# Patient Record
Sex: Female | Born: 1970 | Race: White | Hispanic: No | Smoking: Current every day smoker
Health system: Southern US, Community
[De-identification: ages and names within clinical notes are randomized; demographics above are authoritative.]

## PROBLEM LIST (undated history)

## (undated) DIAGNOSIS — F32A Depression, unspecified: Secondary | ICD-10-CM

## (undated) DIAGNOSIS — J439 Emphysema, unspecified: Secondary | ICD-10-CM

## (undated) DIAGNOSIS — Z720 Tobacco use: Secondary | ICD-10-CM

## (undated) DIAGNOSIS — Z8619 Personal history of other infectious and parasitic diseases: Secondary | ICD-10-CM

## (undated) DIAGNOSIS — K219 Gastro-esophageal reflux disease without esophagitis: Secondary | ICD-10-CM

## (undated) DIAGNOSIS — F329 Major depressive disorder, single episode, unspecified: Secondary | ICD-10-CM

## (undated) DIAGNOSIS — I38 Endocarditis, valve unspecified: Secondary | ICD-10-CM

## (undated) DIAGNOSIS — F1911 Other psychoactive substance abuse, in remission: Secondary | ICD-10-CM

## (undated) DIAGNOSIS — E119 Type 2 diabetes mellitus without complications: Secondary | ICD-10-CM

## (undated) DIAGNOSIS — M1990 Unspecified osteoarthritis, unspecified site: Secondary | ICD-10-CM

## (undated) DIAGNOSIS — J449 Chronic obstructive pulmonary disease, unspecified: Secondary | ICD-10-CM

## (undated) HISTORY — DX: Unspecified osteoarthritis, unspecified site: M19.90

## (undated) HISTORY — DX: Endocarditis, valve unspecified: I38

## (undated) HISTORY — DX: Depression, unspecified: F32.A

## (undated) HISTORY — DX: Emphysema, unspecified: J43.9

## (undated) HISTORY — DX: Other psychoactive substance abuse, in remission: F19.11

## (undated) HISTORY — DX: Gastro-esophageal reflux disease without esophagitis: K21.9

## (undated) HISTORY — DX: Major depressive disorder, single episode, unspecified: F32.9

## (undated) HISTORY — DX: Personal history of other infectious and parasitic diseases: Z86.19

---

## 1999-01-08 HISTORY — PX: TUBAL LIGATION: SHX77

## 2005-01-07 ENCOUNTER — Emergency Department: Payer: Self-pay | Admitting: General Practice

## 2006-03-05 ENCOUNTER — Encounter: Admission: RE | Admit: 2006-03-05 | Discharge: 2006-03-05 | Payer: Self-pay | Admitting: Family Medicine

## 2007-04-07 ENCOUNTER — Encounter: Payer: Self-pay | Admitting: Family Medicine

## 2007-04-07 ENCOUNTER — Ambulatory Visit: Payer: Self-pay | Admitting: Family Medicine

## 2008-09-22 ENCOUNTER — Ambulatory Visit: Payer: Self-pay | Admitting: Obstetrics & Gynecology

## 2008-09-29 ENCOUNTER — Ambulatory Visit: Payer: Self-pay | Admitting: Obstetrics & Gynecology

## 2010-02-20 ENCOUNTER — Emergency Department: Payer: Self-pay | Admitting: Emergency Medicine

## 2010-05-22 NOTE — Assessment & Plan Note (Signed)
Ashley Powers, Ashley Powers                   ACCOUNT NO.:  [id number]   MEDICAL RECORD NO.:  [id number]          PATIENT TYPE:  POB   LOCATION:  [facility] at [facility]         FACILITY:  [facility]   PHYSICIAN:  Tinnie Gens, MD        DATE OF BIRTH:  [birth year]   DATE OF SERVICE:  04/07/2007                                  CLINIC NOTE   CHIEF COMPLAINT:  Physical exam.   HISTORY OF PRESENT ILLNESS:  The patient is a 40 year old, gravida 2,  para 2, who comes in today for a physical exam.  She has just now gotten  insurance again.  The patient was previously seen for anxiety and  depression and an overactive bladder and treated with some Cymbalta,  Detrol LA and Percocet for chronic back pain.  She has not had medicine  for some time.  She has not been seeing a doctor but previously had a  primary care specialist seeing her.  The patient continues to have  regular cycles.  However, they are fairly heavy, last approximately 7  days, and has severe pain.  She is status post tubal ligation the past.  She has one sexual partner.  She denies significant vaginal discharge.   PAST MEDICAL HISTORY:  Significant for anxiety and depression.   PAST SURGICAL HISTORY:  Tubal ligation.   MEDICATIONS:  Over-the-counter pain relievers like Tylenol as needed.   ALLERGIES:  None known.   OBSTETRICAL HISTORY:  G2, P2, two vaginal deliveries.   GYN HISTORY:  History of abnormal Pap with colposcopy in the past. Last  Pap was a few years ago.   FAMILY HISTORY:  Diabetes, heart disease, skin cancer in her mother.   SOCIAL HISTORY:  She lives with her son and her boyfriend. Her daughter  has been separated from her mother.  She gets her on the weekend.  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 She is a smoker of approximately one for the last 18 years. She  has a history of crack cocaine use approximately 9 years ago. She went  to [place] and got treated.   REVIEW OF SYSTEMS:   A 14-point Review of Systems is reviewed.  Please  see GYN history in the chart.  Positive for muscle aches, fever,  fatigue, headache, shortness of breath, chest pain, loss of urine with  coughing, sneezing and painful intercourse.   PHYSICAL EXAMINATION:  GENERAL:  On exam today, in general she is a well-  developed, well-nourished female in no acute distress.  VITAL SIGNS:  As noted in the chart.  Weight is 119, blood pressure  118/74, pulse 73.  HEENT:  Normocephalic, atraumatic.  Sclerae anicteric.  NECK:  Supple.  Normal thyroid.  BREASTS:  Symmetric with everted nipples.  ABDOMEN:  Soft, nontender, nondistended.  GU:  Normal external female genitalia.  BUS normal.  Vagina is pink and  rugated.  Cervix is parous without lesion.  Uterus small, anteverted.  No adnexal mass or tenderness.   IMPRESSION:  1. Gynecological exam with Pap.  2. Tobacco use.  3. Anxiety and depression.  4. Dysmenorrhea  and menorrhagia.  5. Chronic pain.   PLAN:  1. We will restart Cymbalta and Detrol LA. Check Pap smear today.  2. Start Chantix.  Advised about quit line and calling that, and tips      for quitting smoking given to the patient.  3. Prescription for Naprosyn also given for painful periods.  She is      not a candidate for ACs given her age and smoking status.  4. Possibly candidate for endometrial ablation should medications fail      to alleviate symptoms.  5. She will follow up in 2 months.           ______________________________  Tinnie Gens, MD     TP/MEDQ  D:  04/07/2007  T:  04/07/2007  Job:  [id number]

## 2012-06-17 ENCOUNTER — Ambulatory Visit: Payer: Self-pay

## 2013-06-28 ENCOUNTER — Encounter (INDEPENDENT_AMBULATORY_CARE_PROVIDER_SITE_OTHER): Payer: Self-pay

## 2013-06-28 ENCOUNTER — Encounter: Payer: Self-pay | Admitting: Adult Health

## 2013-06-28 ENCOUNTER — Ambulatory Visit (INDEPENDENT_AMBULATORY_CARE_PROVIDER_SITE_OTHER): Payer: BC Managed Care – PPO | Admitting: Adult Health

## 2013-06-28 VITALS — BP 130/88 | HR 78 | Temp 98.4°F | Resp 14 | Ht 65.0 in | Wt 143.5 lb

## 2013-06-28 DIAGNOSIS — F32A Depression, unspecified: Secondary | ICD-10-CM

## 2013-06-28 DIAGNOSIS — F3289 Other specified depressive episodes: Secondary | ICD-10-CM

## 2013-06-28 DIAGNOSIS — Z23 Encounter for immunization: Secondary | ICD-10-CM

## 2013-06-28 DIAGNOSIS — F329 Major depressive disorder, single episode, unspecified: Secondary | ICD-10-CM | POA: Insufficient documentation

## 2013-06-28 MED ORDER — FLUOXETINE HCL 20 MG PO TABS: 20.0000 mg | ORAL_TABLET | Freq: Every day | ORAL | Status: DC

## 2013-06-28 NOTE — Addendum Note (Signed)
Addended by: Geni Bers on: 06/28/2013 02:36 PM   Modules accepted: Orders

## 2013-06-28 NOTE — Progress Notes (Signed)
Patient ID: Ashley Powers, female   DOB: Dec 08, 1970, 43 y.o.   MRN: [id number]   Subjective:    Patient ID: Ashley Powers, female    DOB: [birth year], 43 y.o.   MRN: [id number]  HPI Pt is a pleasant 43 year old female who presents to clinic to establish care. She has not had a PCP in many years. Recent Pap and mammogram done at [facility] 2014. She has history of depression with ongoing symptoms. Reports that sometimes she does not feel like getting out of bed. In the past, she has had suicidal ideations. Denies any at the present time. Ongoing tobacco abuse with a history of emphysema seen on x-ray per patient report. I will request records from previous PCP.    Past Medical History  Diagnosis Date  . Arthritis   . Depression   . Emphysema of lung   . GERD (gastroesophageal reflux disease)   . History of drug abuse     about 10yrs ago - smoking crack  . History of chicken pox   . Leaky heart valve     needs ABT before dental procedures     Past Surgical History  Procedure Laterality Date  . Tubal ligation  2001     Family History  Problem Relation Age of Onset  . Arthritis Mother   . Cancer Mother     melanoma  . Heart disease Father     4 heart attacks. Died age 28  . Diabetes Father   . Diabetes Paternal Aunt   . Alcohol abuse Maternal Grandfather   . Arthritis Paternal Grandmother   . Alcohol abuse Paternal Grandfather      History   Social History  . Marital Status: Married    Spouse Name: N/A    Number of Children: 2  . Years of Education: N/A   Occupational History  . Theatre manager   Social History Main Topics  . Smoking status: Current Every Day Smoker -- 1.00 packs/day for 24 years    Types: Cigarettes  . Smokeless tobacco: Never Used  . Alcohol Use: 1.2 oz/week    2 Glasses of wine per week  . Drug Use: Yes     Comment: marijuana occasionally   . Sexual Activity: Not on file   Other Topics Concern  . Not on  file   Social History Narrative   Ashley Powers grew up in [place], [place]. She is married and has two children Ashley Powers, Ashley Powers). 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 Hobbies: reading.      Caffeine - 1 cup of coffee   Exercise - none at present    Review of Systems  Constitutional: Negative.   HENT: Negative.   Eyes: Negative.   Respiratory: Negative.   Cardiovascular: Negative.   Gastrointestinal: Negative.   Endocrine: Negative.   Genitourinary: Negative.   Musculoskeletal: Negative.   Skin: Negative.   Allergic/Immunologic: Negative.   Neurological: Negative.   Hematological: Negative.   Psychiatric/Behavioral:       Depression. Sometimes does not want to get out of bed       Objective:  BP 130/88  Pulse 78  Temp(Src) 98.4 F (36.9 C) (Oral)  Resp 14  Ht 5\' 5"  (1.651 m)  Wt 143 lb 8 oz (65.091 kg)  BMI 23.88 kg/m2  SpO2 96%   Physical Exam  Constitutional: She is oriented to person, place, and time. No distress.  HENT:  Head:  Normocephalic and atraumatic.  Eyes: Conjunctivae and EOM are normal.  Neck: Normal range of motion. Neck supple.  Cardiovascular: Normal rate, regular rhythm, normal heart sounds and intact distal pulses.  Exam reveals no gallop and no friction rub.   No murmur heard. Pulmonary/Chest: Effort normal and breath sounds normal. No respiratory distress. She has no wheezes. She has no rales.  Musculoskeletal: Normal range of motion.  Neurological: She is alert and oriented to person, place, and time. She has normal reflexes. Coordination normal.  Skin: Skin is warm and dry.  Psychiatric: She has a normal mood and affect. Her behavior is normal. Judgment and thought content normal.      Assessment & Plan:   1. Depression Start prozac 20 mg daily. RTC in 1 month for follow up. I have asked her to come to next visit fasting so that I can check labs.

## 2013-06-28 NOTE — Progress Notes (Signed)
Pre visit review using our clinic review tool, if applicable. No additional management support is needed unless otherwise documented below in the visit note. 

## 2013-06-28 NOTE — Patient Instructions (Signed)
  Start Prozac 20 mg every morning.  Please schedule appointment in 1 month for follow up.  Please do not eat or drink anything prior to coming to the next appointment so that I can check your labs.

## 2013-07-26 ENCOUNTER — Ambulatory Visit: Payer: BC Managed Care – PPO | Admitting: Adult Health

## 2013-07-26 DIAGNOSIS — Z0289 Encounter for other administrative examinations: Secondary | ICD-10-CM

## 2014-12-16 ENCOUNTER — Other Ambulatory Visit: Payer: Self-pay | Admitting: Adult Health

## 2014-12-16 DIAGNOSIS — F17200 Nicotine dependence, unspecified, uncomplicated: Secondary | ICD-10-CM | POA: Insufficient documentation

## 2014-12-16 DIAGNOSIS — F320 Major depressive disorder, single episode, mild: Secondary | ICD-10-CM | POA: Insufficient documentation

## 2014-12-19 ENCOUNTER — Other Ambulatory Visit: Payer: Self-pay | Admitting: Adult Health

## 2014-12-19 DIAGNOSIS — Z0000 Encounter for general adult medical examination without abnormal findings: Secondary | ICD-10-CM

## 2015-01-04 ENCOUNTER — Ambulatory Visit
Admission: RE | Admit: 2015-01-04 | Discharge: 2015-01-04 | Disposition: A | Discharge status: Home / Self Care | Payer: BLUE CROSS/BLUE SHIELD | Source: Ambulatory Visit | Attending: Family Medicine | Admitting: Family Medicine

## 2015-01-04 DIAGNOSIS — Z1231 Encounter for screening mammogram for malignant neoplasm of breast: Secondary | ICD-10-CM | POA: Insufficient documentation

## 2015-01-04 DIAGNOSIS — Z0000 Encounter for general adult medical examination without abnormal findings: Secondary | ICD-10-CM

## 2015-04-06 ENCOUNTER — Other Ambulatory Visit: Payer: Self-pay | Admitting: Adult Health

## 2015-04-06 DIAGNOSIS — N939 Abnormal uterine and vaginal bleeding, unspecified: Secondary | ICD-10-CM

## 2015-04-07 ENCOUNTER — Ambulatory Visit
Admission: RE | Admit: 2015-04-07 | Discharge: 2015-04-07 | Disposition: A | Discharge status: Home / Self Care | Payer: BLUE CROSS/BLUE SHIELD | Source: Ambulatory Visit | Attending: Adult Health | Admitting: Adult Health

## 2015-04-07 DIAGNOSIS — N939 Abnormal uterine and vaginal bleeding, unspecified: Secondary | ICD-10-CM | POA: Diagnosis present

## 2015-04-07 DIAGNOSIS — D72825 Bandemia: Secondary | ICD-10-CM | POA: Insufficient documentation

## 2015-04-10 ENCOUNTER — Other Ambulatory Visit: Payer: Self-pay | Admitting: Obstetrics and Gynecology

## 2015-04-10 DIAGNOSIS — N898 Other specified noninflammatory disorders of vagina: Secondary | ICD-10-CM

## 2015-05-02 ENCOUNTER — Ambulatory Visit: Payer: BLUE CROSS/BLUE SHIELD

## 2017-08-29 ENCOUNTER — Other Ambulatory Visit: Payer: Self-pay | Admitting: Podiatry

## 2017-08-29 ENCOUNTER — Encounter: Payer: Self-pay | Admitting: Podiatry

## 2017-08-29 ENCOUNTER — Ambulatory Visit (INDEPENDENT_AMBULATORY_CARE_PROVIDER_SITE_OTHER): Payer: BLUE CROSS/BLUE SHIELD

## 2017-08-29 ENCOUNTER — Ambulatory Visit: Payer: BLUE CROSS/BLUE SHIELD | Admitting: Podiatry

## 2017-08-29 DIAGNOSIS — M779 Enthesopathy, unspecified: Secondary | ICD-10-CM

## 2017-08-29 DIAGNOSIS — M722 Plantar fascial fibromatosis: Secondary | ICD-10-CM

## 2017-08-29 MED ORDER — METHYLPREDNISOLONE 4 MG PO TBPK: ORAL_TABLET | ORAL | 0 refills | Status: DC

## 2017-08-29 MED ORDER — MELOXICAM 15 MG PO TABS: 15.0000 mg | ORAL_TABLET | Freq: Every day | ORAL | 1 refills | Status: AC

## 2017-09-01 NOTE — Progress Notes (Signed)
   Subjective: 47 year old female presenting today as a new patient with a chief complaint of burning pain to the bilateral feet and arches, right worse than left, that began 2-3 months ago. She reports the feet feel tired. She states the pain is worse after walking and standing for long periods of time and when she first gets out of bed in the morning. Standing after sitting for a while also increases the pain. She has applied ice therapy and has been massaging the feet for treatment. Patient is here for further evaluation and treatment.   Past Medical History:  Diagnosis Date  . Arthritis   . Depression   . Emphysema of lung ([place])   . GERD (gastroesophageal reflux disease)   . History of chicken pox   . History of drug abuse    about 47yrs ago - smoking crack  . Leaky heart valve    needs ABT before dental procedures     Objective: Physical Exam General: The patient is alert and oriented x3 in no acute distress.  Dermatology: Skin is warm, dry and supple bilateral lower extremities. Negative for open lesions or macerations bilateral.   Vascular: Dorsalis Pedis and Posterior Tibial pulses palpable bilateral.  Capillary fill time is immediate to all digits.  Neurological: Epicritic and protective threshold intact bilateral.   Musculoskeletal: Tenderness to palpation to the plantar aspect of the bilateral heels along the plantar fascia. All other joints range of motion within normal limits bilateral. Strength 5/5 in all groups bilateral.   Radiographic exam: Normal osseous mineralization. Joint spaces preserved. No fracture/dislocation/boney destruction. No other soft tissue abnormalities or radiopaque foreign bodies.   Assessment: 1. plantar fasciitis bilateral feet - midsubstance   Plan of Care:  1. Patient evaluated. Xrays reviewed.   2. Injection of 0.5cc Celestone soluspan injected into the bilateral heels.  3. Rx for Medrol Dose Pak placed 4. Rx for Meloxicam ordered for  patient. 5. Plantar fascial band(s) dispensed for bilateral plantar fasciitis. 6. Instructed patient regarding therapies and modalities at home to alleviate symptoms.  7. Recommended OTC insoles from [employer].  8. Return to clinic in 4 weeks.    Edrick Kins, DPM [facility]  Dr. Edrick Kins, DPM    [address], [place] [address]                Office [phone]  Fax [phone]

## 2017-09-23 ENCOUNTER — Ambulatory Visit (INDEPENDENT_AMBULATORY_CARE_PROVIDER_SITE_OTHER): Payer: BLUE CROSS/BLUE SHIELD | Admitting: Podiatry

## 2017-09-23 ENCOUNTER — Encounter: Payer: Self-pay | Admitting: Podiatry

## 2017-09-23 DIAGNOSIS — M722 Plantar fascial fibromatosis: Secondary | ICD-10-CM

## 2017-09-25 NOTE — Progress Notes (Signed)
   Subjective: 47 year old female presenting today for follow up evaluation of bilateral plantar fasciitis. She reports some pain last week but states it was not as bad as it has been in the past. She has been wearing the fascial braces and taking Meloxicam which helps alleviate the pain. Standing or walking excessively increases the symptoms. Patient is here for further evaluation and treatment.   Past Medical History:  Diagnosis Date  . Arthritis   . Depression   . Emphysema of lung ([place])   . GERD (gastroesophageal reflux disease)   . History of chicken pox   . History of drug abuse    about 61yrs ago - smoking crack  . Leaky heart valve    needs ABT before dental procedures     Objective: Physical Exam General: The patient is alert and oriented x3 in no acute distress.  Dermatology: Skin is warm, dry and supple bilateral lower extremities. Negative for open lesions or macerations bilateral.   Vascular: Dorsalis Pedis and Posterior Tibial pulses palpable bilateral.  Capillary fill time is immediate to all digits.  Neurological: Epicritic and protective threshold intact bilateral.   Musculoskeletal: Tenderness to palpation to the plantar aspect of the bilateral heels along the plantar fascia. All other joints range of motion within normal limits bilateral. Strength 5/5 in all groups bilateral.    Assessment: 1. plantar fasciitis bilateral feet - midsubstance   Plan of Care:  1. Patient evaluated. 2. Injection of 0.5cc Celestone soluspan injected into the bilateral heels.  3. Continue taking Meloxicam.  4. Recommended good shoe gear.  5. Return to clinic in 4 weeks.   Works at [employer].   Edrick Kins, DPM [facility]  Dr. Edrick Kins, DPM    [address], [place] [address]                Office [phone]  Fax [phone]

## 2017-10-21 ENCOUNTER — Encounter: Payer: BLUE CROSS/BLUE SHIELD | Admitting: Podiatry

## 2017-10-27 NOTE — Progress Notes (Signed)
This encounter was created in error - please disregard.

## 2020-03-21 ENCOUNTER — Inpatient Hospital Stay
Admission: EM | Admit: 2020-03-21 | Discharge: 2020-03-23 | DRG: 493 | Disposition: A | Discharge status: Home / Self Care | Payer: Worker's Compensation | Attending: Orthopedic Surgery | Admitting: Orthopedic Surgery

## 2020-03-21 ENCOUNTER — Encounter: Payer: Self-pay | Admitting: Medical Oncology

## 2020-03-21 ENCOUNTER — Emergency Department: Payer: Worker's Compensation

## 2020-03-21 ENCOUNTER — Other Ambulatory Visit: Payer: Self-pay

## 2020-03-21 DIAGNOSIS — F17210 Nicotine dependence, cigarettes, uncomplicated: Secondary | ICD-10-CM | POA: Diagnosis present

## 2020-03-21 DIAGNOSIS — D72829 Elevated white blood cell count, unspecified: Secondary | ICD-10-CM | POA: Diagnosis present

## 2020-03-21 DIAGNOSIS — Z8261 Family history of arthritis: Secondary | ICD-10-CM | POA: Diagnosis not present

## 2020-03-21 DIAGNOSIS — Z7984 Long term (current) use of oral hypoglycemic drugs: Secondary | ICD-10-CM | POA: Diagnosis not present

## 2020-03-21 DIAGNOSIS — W19XXXA Unspecified fall, initial encounter: Secondary | ICD-10-CM | POA: Diagnosis not present

## 2020-03-21 DIAGNOSIS — S82241A Displaced spiral fracture of shaft of right tibia, initial encounter for closed fracture: Secondary | ICD-10-CM | POA: Diagnosis present

## 2020-03-21 DIAGNOSIS — W1789XA Other fall from one level to another, initial encounter: Secondary | ICD-10-CM | POA: Diagnosis present

## 2020-03-21 DIAGNOSIS — Y9289 Other specified places as the place of occurrence of the external cause: Secondary | ICD-10-CM

## 2020-03-21 DIAGNOSIS — Z833 Family history of diabetes mellitus: Secondary | ICD-10-CM | POA: Diagnosis not present

## 2020-03-21 DIAGNOSIS — J439 Emphysema, unspecified: Secondary | ICD-10-CM | POA: Diagnosis present

## 2020-03-21 DIAGNOSIS — R739 Hyperglycemia, unspecified: Secondary | ICD-10-CM

## 2020-03-21 DIAGNOSIS — S82201A Unspecified fracture of shaft of right tibia, initial encounter for closed fracture: Secondary | ICD-10-CM | POA: Diagnosis not present

## 2020-03-21 DIAGNOSIS — F32A Depression, unspecified: Secondary | ICD-10-CM | POA: Diagnosis present

## 2020-03-21 DIAGNOSIS — Y990 Civilian activity done for income or pay: Secondary | ICD-10-CM | POA: Diagnosis not present

## 2020-03-21 DIAGNOSIS — Z8249 Family history of ischemic heart disease and other diseases of the circulatory system: Secondary | ICD-10-CM

## 2020-03-21 DIAGNOSIS — Z79899 Other long term (current) drug therapy: Secondary | ICD-10-CM

## 2020-03-21 DIAGNOSIS — K219 Gastro-esophageal reflux disease without esophagitis: Secondary | ICD-10-CM | POA: Diagnosis present

## 2020-03-21 DIAGNOSIS — Z419 Encounter for procedure for purposes other than remedying health state, unspecified: Secondary | ICD-10-CM

## 2020-03-21 DIAGNOSIS — S82401A Unspecified fracture of shaft of right fibula, initial encounter for closed fracture: Secondary | ICD-10-CM | POA: Diagnosis present

## 2020-03-21 DIAGNOSIS — Z20822 Contact with and (suspected) exposure to covid-19: Secondary | ICD-10-CM | POA: Diagnosis present

## 2020-03-21 DIAGNOSIS — Z720 Tobacco use: Secondary | ICD-10-CM | POA: Diagnosis not present

## 2020-03-21 DIAGNOSIS — Z808 Family history of malignant neoplasm of other organs or systems: Secondary | ICD-10-CM | POA: Diagnosis not present

## 2020-03-21 DIAGNOSIS — J449 Chronic obstructive pulmonary disease, unspecified: Secondary | ICD-10-CM | POA: Diagnosis not present

## 2020-03-21 DIAGNOSIS — I38 Endocarditis, valve unspecified: Secondary | ICD-10-CM | POA: Diagnosis present

## 2020-03-21 DIAGNOSIS — S82831A Other fracture of upper and lower end of right fibula, initial encounter for closed fracture: Secondary | ICD-10-CM

## 2020-03-21 DIAGNOSIS — E1165 Type 2 diabetes mellitus with hyperglycemia: Secondary | ICD-10-CM | POA: Diagnosis present

## 2020-03-21 DIAGNOSIS — Z9851 Tubal ligation status: Secondary | ICD-10-CM

## 2020-03-21 DIAGNOSIS — Z3201 Encounter for pregnancy test, result positive: Secondary | ICD-10-CM | POA: Diagnosis present

## 2020-03-21 DIAGNOSIS — E119 Type 2 diabetes mellitus without complications: Secondary | ICD-10-CM | POA: Diagnosis not present

## 2020-03-21 HISTORY — DX: Tobacco use: Z72.0

## 2020-03-21 HISTORY — DX: Chronic obstructive pulmonary disease, unspecified: J44.9

## 2020-03-21 LAB — BASIC METABOLIC PANEL
Anion gap: 13 (ref 5–15)
BUN: 13 mg/dL (ref 6–20)
CO2: 20 mmol/L — ABNORMAL LOW (ref 22–32)
Calcium: 8.8 mg/dL — ABNORMAL LOW (ref 8.9–10.3)
Chloride: 102 mmol/L (ref 98–111)
Creatinine, Ser: 0.72 mg/dL (ref 0.44–1.00)
GFR, Estimated: >60 mL/min (ref >60)
Glucose, Bld: 248 mg/dL — ABNORMAL HIGH (ref 70–99)
Potassium: 4 mmol/L (ref 3.5–5.1)
Sodium: 135 mmol/L (ref 135–145)

## 2020-03-21 LAB — GLUCOSE, CAPILLARY
Glucose-Capillary: 167 mg/dL — ABNORMAL HIGH (ref 70–99)
Glucose-Capillary: 182 mg/dL — ABNORMAL HIGH (ref 70–99)

## 2020-03-21 LAB — RESP PANEL BY RT-PCR (FLU A&B, COVID) ARPGX2
Influenza A by PCR: NEGATIVE
Influenza B by PCR: NEGATIVE
SARS Coronavirus 2 by RT PCR: NEGATIVE

## 2020-03-21 LAB — CBC WITH DIFFERENTIAL/PLATELET
Abs Immature Granulocytes: 0.04 10*3/uL (ref 0.00–0.07)
Basophils Absolute: 0.1 10*3/uL (ref 0.0–0.1)
Basophils Relative: 1 %
Eosinophils Absolute: 0.1 10*3/uL (ref 0.0–0.5)
Eosinophils Relative: 1 %
HCT: 44.4 % (ref 36.0–46.0)
Hemoglobin: 14.9 g/dL (ref 12.0–15.0)
Immature Granulocytes: 0 %
Lymphocytes Relative: 30 %
Lymphs Abs: 3.3 10*3/uL (ref 0.7–4.0)
MCH: 32.7 pg (ref 26.0–34.0)
MCHC: 33.6 g/dL (ref 30.0–36.0)
MCV: 97.6 fL (ref 80.0–100.0)
Monocytes Absolute: 0.8 10*3/uL (ref 0.1–1.0)
Monocytes Relative: 7 %
Neutro Abs: 6.7 10*3/uL (ref 1.7–7.7)
Neutrophils Relative %: 61 %
Platelets: 257 10*3/uL (ref 150–400)
RBC: 4.55 MIL/uL (ref 3.87–5.11)
RDW: 12.8 % (ref 11.5–15.5)
WBC: 11 10*3/uL — ABNORMAL HIGH (ref 4.0–10.5)
nRBC: 0 % (ref 0.0–0.2)

## 2020-03-21 LAB — TYPE AND SCREEN
ABO/RH(D): O POS
Antibody Screen: NEGATIVE

## 2020-03-21 LAB — URINE DRUG SCREEN, QUALITATIVE (ARMC ONLY)
Amphetamines, Ur Screen: NOT DETECTED
Barbiturates, Ur Screen: NOT DETECTED
Benzodiazepine, Ur Scrn: NOT DETECTED
Cannabinoid 50 Ng, Ur ~~LOC~~: POSITIVE — AB
Cocaine Metabolite,Ur ~~LOC~~: NOT DETECTED
MDMA (Ecstasy)Ur Screen: NOT DETECTED
Methadone Scn, Ur: NOT DETECTED
Opiate, Ur Screen: POSITIVE — AB
Phencyclidine (PCP) Ur S: NOT DETECTED
Tricyclic, Ur Screen: NOT DETECTED

## 2020-03-21 LAB — APTT: aPTT: 29 seconds (ref 24–36)

## 2020-03-21 LAB — BRAIN NATRIURETIC PEPTIDE: B Natriuretic Peptide: 15.4 pg/mL (ref 0.0–100.0)

## 2020-03-21 LAB — PROTIME-INR
INR: 1 (ref 0.8–1.2)
Prothrombin Time: 12.7 seconds (ref 11.4–15.2)

## 2020-03-21 LAB — SURGICAL PCR SCREEN
MRSA, PCR: NEGATIVE
Staphylococcus aureus: NEGATIVE

## 2020-03-21 LAB — HCG, QUANTITATIVE, PREGNANCY: hCG, Beta Chain, Quant, S: 17 m[IU]/mL — ABNORMAL HIGH (ref <5)

## 2020-03-21 MED ORDER — ACETAMINOPHEN 325 MG PO TABS: 650.0000 mg | ORAL_TABLET | Freq: Four times a day (QID) | ORAL | Status: DC | PRN

## 2020-03-21 MED ORDER — METHOCARBAMOL 500 MG PO TABS
500.0000 mg | ORAL_TABLET | Freq: Four times a day (QID) | ORAL | Status: DC | PRN
  Administered 2020-03-21: 500 mg via ORAL
  Filled 2020-03-21 (×2): qty 1

## 2020-03-21 MED ORDER — HYDROMORPHONE HCL 1 MG/ML IJ SOLN: 0.5000 mg | INTRAMUSCULAR | Status: DC | PRN

## 2020-03-21 MED ORDER — FENTANYL CITRATE (PF) 100 MCG/2ML IJ SOLN
50.0000 ug | Freq: Once | INTRAMUSCULAR | Status: AC
  Administered 2020-03-21: 50 ug via INTRAVENOUS
  Filled 2020-03-21: qty 2

## 2020-03-21 MED ORDER — SENNA 8.6 MG PO TABS
1.0000 | ORAL_TABLET | Freq: Two times a day (BID) | ORAL | Status: DC
  Administered 2020-03-21 – 2020-03-23 (×4): 8.6 mg via ORAL
  Filled 2020-03-21 (×4): qty 1

## 2020-03-21 MED ORDER — HYDRALAZINE HCL 20 MG/ML IJ SOLN: 5.0000 mg | INTRAMUSCULAR | Status: DC | PRN

## 2020-03-21 MED ORDER — NICOTINE 21 MG/24HR TD PT24
21.0000 mg | MEDICATED_PATCH | Freq: Once | TRANSDERMAL | Status: AC
  Administered 2020-03-21: 21 mg via TRANSDERMAL
  Filled 2020-03-21: qty 1

## 2020-03-21 MED ORDER — HYDROMORPHONE HCL 1 MG/ML IJ SOLN
1.0000 mg | Freq: Once | INTRAMUSCULAR | Status: AC
  Administered 2020-03-21: 1 mg via INTRAVENOUS
  Filled 2020-03-21: qty 1

## 2020-03-21 MED ORDER — BISACODYL 10 MG RE SUPP
10.0000 mg | Freq: Every day | RECTAL | Status: DC | PRN
  Filled 2020-03-21: qty 1

## 2020-03-21 MED ORDER — HYDROMORPHONE HCL 1 MG/ML IJ SOLN
1.0000 mg | INTRAMUSCULAR | Status: DC | PRN
  Administered 2020-03-21 – 2020-03-22 (×5): 1 mg via INTRAVENOUS
  Filled 2020-03-21 (×5): qty 1

## 2020-03-21 MED ORDER — HYDROMORPHONE HCL 1 MG/ML IJ SOLN
1.0000 mg | Freq: Once | INTRAMUSCULAR | Status: DC
Start: 2020-03-21 — End: 2020-03-21

## 2020-03-21 MED ORDER — IPRATROPIUM BROMIDE HFA 17 MCG/ACT IN AERS
2.0000 | INHALATION_SPRAY | Freq: Four times a day (QID) | RESPIRATORY_TRACT | Status: DC
  Administered 2020-03-21 – 2020-03-23 (×8): 2 via RESPIRATORY_TRACT
  Filled 2020-03-21: qty 12.9

## 2020-03-21 MED ORDER — INSULIN ASPART 100 UNIT/ML ~~LOC~~ SOLN
0.0000 [IU] | Freq: Three times a day (TID) | SUBCUTANEOUS | Status: DC
  Administered 2020-03-22 – 2020-03-23 (×2): 2 [IU] via SUBCUTANEOUS
  Administered 2020-03-23: 1 [IU] via SUBCUTANEOUS
  Filled 2020-03-21 (×2): qty 1

## 2020-03-21 MED ORDER — DOCUSATE SODIUM 100 MG PO CAPS: 100.0000 mg | ORAL_CAPSULE | Freq: Two times a day (BID) | ORAL | Status: DC

## 2020-03-21 MED ORDER — MAGNESIUM CITRATE PO SOLN: 1.0000 | Freq: Once | ORAL | Status: DC | PRN

## 2020-03-21 MED ORDER — INSULIN ASPART 100 UNIT/ML ~~LOC~~ SOLN
0.0000 [IU] | Freq: Every day | SUBCUTANEOUS | Status: DC
  Administered 2020-03-22: 2 [IU] via SUBCUTANEOUS
  Filled 2020-03-21: qty 1

## 2020-03-21 MED ORDER — SODIUM CHLORIDE 0.9 % IV SOLN: INTRAVENOUS | Status: DC

## 2020-03-21 MED ORDER — DM-GUAIFENESIN ER 30-600 MG PO TB12
1.0000 | ORAL_TABLET | Freq: Two times a day (BID) | ORAL | Status: DC | PRN
  Filled 2020-03-21: qty 1

## 2020-03-21 MED ORDER — METHOCARBAMOL 1000 MG/10ML IJ SOLN
500.0000 mg | Freq: Four times a day (QID) | INTRAVENOUS | Status: DC | PRN
  Administered 2020-03-22: 500 mg via INTRAVENOUS
  Filled 2020-03-21 (×2): qty 5

## 2020-03-21 MED ORDER — ALBUTEROL SULFATE HFA 108 (90 BASE) MCG/ACT IN AERS
2.0000 | INHALATION_SPRAY | RESPIRATORY_TRACT | Status: DC | PRN
  Filled 2020-03-21: qty 6.7

## 2020-03-21 MED ORDER — SENNOSIDES-DOCUSATE SODIUM 8.6-50 MG PO TABS: 1.0000 | ORAL_TABLET | Freq: Every evening | ORAL | Status: DC | PRN

## 2020-03-21 MED ORDER — ONDANSETRON HCL 4 MG/2ML IJ SOLN: 4.0000 mg | Freq: Three times a day (TID) | INTRAMUSCULAR | Status: DC | PRN

## 2020-03-21 MED ORDER — OXYCODONE-ACETAMINOPHEN 5-325 MG PO TABS
1.0000 | ORAL_TABLET | ORAL | Status: DC | PRN
  Administered 2020-03-21 – 2020-03-22 (×3): 1 via ORAL
  Filled 2020-03-21 (×3): qty 1

## 2020-03-21 MED ORDER — PANTOPRAZOLE SODIUM 40 MG PO TBEC: 40.0000 mg | DELAYED_RELEASE_TABLET | Freq: Every day | ORAL | Status: DC | PRN

## 2020-03-21 MED ORDER — OXYCODONE HCL 5 MG PO TABS
5.0000 mg | ORAL_TABLET | ORAL | Status: DC | PRN
Start: 2020-03-21 — End: 2020-03-21

## 2020-03-21 MED ORDER — MUPIROCIN 2 % EX OINT
1.0000 "application " | TOPICAL_OINTMENT | Freq: Two times a day (BID) | CUTANEOUS | Status: DC
  Administered 2020-03-21: 1 via NASAL
  Filled 2020-03-21: qty 22

## 2020-03-21 MED ORDER — INSULIN ASPART 100 UNIT/ML ~~LOC~~ SOLN: 0.0000 [IU] | SUBCUTANEOUS | Status: DC

## 2020-03-21 MED ORDER — POLYETHYLENE GLYCOL 3350 17 G PO PACK
17.0000 g | PACK | Freq: Two times a day (BID) | ORAL | Status: DC | PRN
  Administered 2020-03-23: 17 g via ORAL
  Filled 2020-03-21: qty 1

## 2020-03-21 MED ORDER — POLYETHYLENE GLYCOL 3350 17 G PO PACK: 17.0000 g | PACK | Freq: Every day | ORAL | Status: DC

## 2020-03-21 NOTE — H&P (Signed)
Medical Consultation   Ashley Powers  MAU:[id number]  DOB: 23-May-1970  DOA: 03/21/2020  PCP: Patient, No Pcp Per   Outpatient Specialists:   Requesting physician: -ortho, Dr. Mack Guise   Reason for consultation: -Presurgical clearance   History of Present Illness: Ashley Powers is an 50 y.o. female with a past medical history of depression, diabetes mellitus, emphysema, tobacco abuse, GERD, leaky heart valve, who is admitted by Dr. Erie Noe due to right tibial fracture.  We are asked to consult for presurgical clearance.  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 She fell about 3 feet and landed on her right leg, injured her right leg, developed pain immediately in the right lower leg.  Pain is constant, sharp, 10 out of 10 severity, not radiating.  No numbness in legs.  Patient strongly denies any head or neck injury.  Refused CT of the head and neck.  Denies loss of consciousness.  Patient has a mild dry cough which she attributes to COPD and smoking, denies shortness of breath and chest pain no fever or chills.  No nausea, vomiting, diarrhea, abdomen pain, symptoms of UTI.  She states that she is not taking medications currently except for occasional Tylenol use.   Lab and image: WBC 11.0, INR 1.0, bHCG 17, negative Covid PCR, electrolytes renal function okay, temperature normal, blood pressure 145/69, heart rate 78, RR 20, oxygen saturation 96% on room air.  Chest x-ray negative.  X-ray showed mild displaced fracture proximal right fibula. Spiral fracture with 1/2 shaft width displacement of the mid to distal tibia.  Review of Systems:   General: no fevers, chills, no changes in body weight, no changes in appetite Skin: no rash HEENT: no blurry vision, hearing changes or sore throat Pulm: no dyspnea, has coughing, no wheezing CV: no chest pain, palpitations, shortness of breath Abd: no  nausea/vomiting, abdominal pain, diarrhea/constipation GU: no dysuria, hematuria, polyuria Ext: has pain in right lower leg Neuro: no weakness, numbness, or tingling. Has fall    Past Medical History: Past Medical History:  Diagnosis Date  . Arthritis   . COPD (chronic obstructive pulmonary disease) ([place])   . Depression   . Emphysema of lung ([place])   . GERD (gastroesophageal reflux disease)   . History of chicken pox   . History of drug abuse ([place])    about 31yrs ago - smoking crack  . Leaky heart valve    needs ABT before dental procedures  . Tobacco abuse     Past Surgical History: Past Surgical History:  Procedure Laterality Date  . TUBAL LIGATION  2001     Allergies:  No Known Allergies   Social History:  reports that she has been smoking cigarettes. She has a 24.00 pack-year smoking history. She has never used smokeless tobacco. She reports current alcohol use of about 2.0 standard drinks of alcohol per week. She reports current drug use.   Family History: Family History  Problem Relation Age of Onset  . Arthritis Mother   . Cancer Mother        melanoma  . Heart disease Father        4 heart attacks. Died age 36  . Diabetes Father   . Diabetes Paternal Aunt   . Alcohol abuse Maternal Grandfather   . Arthritis Paternal Grandmother   . Alcohol  abuse Paternal Grandfather     Physical Exam: Vitals:   03/21/20 1352 03/21/20 1353 03/21/20 1617 03/21/20 1646  BP: (!) 145/69  (!) 160/75 (!) 160/86  Pulse: 78  63 71  Resp: 20  18 18   Temp: 97.9 F (36.6 C)   97.9 F (36.6 C)  TempSrc: Oral     SpO2: 96%  98% 100%  Weight:  65 kg    Height:  5\' 5"  (1.651 m)      General: Not in acute distress HEENT: PERRL, EOMI, no scleral icterus, No JVD or bruit Cardiac: S1/S2, RRR, No murmurs, gallops or rubs Pulm:  No rales, wheezing, rhonchi or rubs. Abd: Soft, nondistended, nontender, no rebound pain, no organomegaly, BS present Ext: No edema. 2+DP/PT pulse  bilaterally Musculoskeletal: has tenderness in right lower leg Skin: No rashes.  Neuro: Alert and oriented X3, cranial nerves II-XII grossly intact Psych: Patient is not psychotic, no suicidal or hemocidal ideation.     Data reviewed:  I have personally reviewed following labs and imaging studies Labs:  CBC: Recent Labs  Lab 03/21/20 1359  WBC 11.0*  NEUTROABS 6.7  HGB 14.9  HCT 44.4  MCV 97.6  PLT 854    Basic Metabolic Panel: Recent Labs  Lab 03/21/20 1359  NA 135  K 4.0  CL 102  CO2 20*  GLUCOSE 248*  BUN 13  CREATININE 0.72  CALCIUM 8.8*   GFR Estimated Creatinine Clearance: 78.2 mL/min (by C-G formula based on SCr of 0.72 mg/dL). Liver Function Tests: No results for input(s): AST, ALT, ALKPHOS, BILITOT, PROT, ALBUMIN in the last 168 hours. No results for input(s): LIPASE, AMYLASE in the last 168 hours. No results for input(s): AMMONIA in the last 168 hours. Coagulation profile Recent Labs  Lab 03/21/20 1502  INR 1.0    Cardiac Enzymes: No results for input(s): CKTOTAL, CKMB, CKMBINDEX, TROPONINI in the last 168 hours. BNP: Invalid input(s): POCBNP CBG: Recent Labs  Lab 03/21/20 1657  GLUCAP 167*   D-Dimer No results for input(s): DDIMER in the last 72 hours. Hgb A1c No results for input(s): HGBA1C in the last 72 hours. Lipid Profile No results for input(s): CHOL, HDL, LDLCALC, TRIG, CHOLHDL, LDLDIRECT in the last 72 hours. Thyroid function studies No results for input(s): TSH, T4TOTAL, T3FREE, THYROIDAB in the last 72 hours.  Invalid input(s): FREET3 Anemia work up No results for input(s): VITAMINB12, FOLATE, FERRITIN, TIBC, IRON, RETICCTPCT in the last 72 hours. Urinalysis No results found for: COLORURINE, APPEARANCEUR, LABSPEC, [place], GLUCOSEU, [place], [place], KETONESUR, PROTEINUR, UROBILINOGEN, NITRITE, LEUKOCYTESUR   Microbiology Recent Results (from the past 240 hour(s))  Resp Panel by RT-PCR (Flu A&B, Covid) Nasopharyngeal  Swab     Status: None   Collection Time: 03/21/20  1:59 PM   Specimen: Nasopharyngeal Swab; Nasopharyngeal(NP) swabs in vial transport medium  Result Value Ref Range Status   SARS Coronavirus 2 by RT PCR NEGATIVE NEGATIVE Final    Comment: (NOTE) SARS-CoV-2 target nucleic acids are NOT DETECTED.  The SARS-CoV-2 RNA is generally detectable in upper respiratory specimens during the acute phase of infection. The lowest concentration of SARS-CoV-2 viral copies this assay can detect is 138 copies/mL. A negative result does not preclude SARS-Cov-2 infection and should not be used as the sole basis for treatment or other patient management decisions. A negative result may occur with  improper specimen collection/handling, submission of specimen other than nasopharyngeal swab, presence of viral mutation(s) within the areas targeted by this assay, and inadequate number  of viral copies(<138 copies/mL). A negative result must be combined with clinical observations, patient history, and epidemiological information. The expected result is Negative.  Fact Sheet for Patients:  EntrepreneurPulse.com.au  Fact Sheet for Healthcare Providers:  IncredibleEmployment.be  This test is no t yet approved or cleared by the [place] FDA and  has been authorized for detection and/or diagnosis of SARS-CoV-2 by FDA under an Emergency Use Authorization (EUA). This EUA will remain  in effect (meaning this test can be used) for the duration of the COVID-19 declaration under Section 564(b)(1) of the Act, 21 U.S.C.section 360bbb-3(b)(1), unless the authorization is terminated  or revoked sooner.       Influenza A by PCR NEGATIVE NEGATIVE Final   Influenza B by PCR NEGATIVE NEGATIVE Final    Comment: (NOTE) The Xpert Xpress SARS-CoV-2/FLU/RSV plus assay is intended as an aid in the diagnosis of influenza from Nasopharyngeal swab specimens and should not be used as a sole  basis for treatment. Nasal washings and aspirates are unacceptable for Xpert Xpress SARS-CoV-2/FLU/RSV testing.  Fact Sheet for Patients: EntrepreneurPulse.com.au  Fact Sheet for Healthcare Providers: IncredibleEmployment.be  This test is not yet approved or cleared by the [place] FDA and has been authorized for detection and/or diagnosis of SARS-CoV-2 by FDA under an Emergency Use Authorization (EUA). This EUA will remain in effect (meaning this test can be used) for the duration of the COVID-19 declaration under Section 564(b)(1) of the Act, 21 U.S.C. section 360bbb-3(b)(1), unless the authorization is terminated or revoked.  Performed at [facility], [address] [place], [place] [address]        Inpatient Medications:   Scheduled Meds: . insulin aspart  0-5 Units Subcutaneous QHS  . [START ON 03/22/2020] insulin aspart  0-9 Units Subcutaneous TID WC  . ipratropium  2 puff Inhalation Q6H  . nicotine  21 mg Transdermal Once  . senna  1 tablet Oral BID   Continuous Infusions: . sodium chloride    . methocarbamol (ROBAXIN) IV       Radiological Exams on Admission: DG Chest 1 View  Result Date: 03/21/2020 CLINICAL DATA:  Preop. History of emphysema. Smoker. Leaky heart valve. EXAM: CHEST  1 VIEW COMPARISON:  None. FINDINGS: The heart size and mediastinal contours are within normal limits. Both lungs are clear. The visualized skeletal structures are unremarkable. IMPRESSION: No active disease. Electronically Signed   By: Kerby Moors M.D.   On: 03/21/2020 15:23   DG Tibia/Fibula Right  Result Date: 03/21/2020 CLINICAL DATA:  Fall EXAM: RIGHT TIBIA AND FIBULA - 2 VIEW COMPARISON:  Right ankle 08/29/2017 FINDINGS: Mild displaced fracture proximal right fibula. Spiral fracture with 1/2 shaft width displacement of the mid to distal tibia. Normal knee and ankle joints. IMPRESSION: Fracture of the right tibia and fibula.  Electronically Signed   By: Franchot Gallo M.D.   On: 03/21/2020 14:50    Impression/Recommendations Principal Problem:   Closed fracture of right tibia and fibula Active Problems:   Depression   Positive pregnancy test   Tobacco abuse   GERD (gastroesophageal reflux disease)   Leaky heart valve   Diabetes mellitus without complication (HCC)   COPD (chronic obstructive pulmonary disease) ([place])   Fall   Closed fracture of right tibia and fibula: As evidenced by x-ray. Patient has severe pain now. No neurovascular compromise. Orthopedic surgeon, Dr. was consulted. Dr. Mack Guise is planning to do surgery tomorrow.  - Pain control: dilaudid prn and percocet - When necessary Zofran  for nausea - Robaxin for muscle spasm - type and cross - INR/PTT - PT/OT when able to (not ordered now) -N.p.o. after midnight  Leukocytosis: WBC 11.0. Likely due to stress-induced demargination. Patient does not have signs of infection. -Follow-up CBC  Depression: Patient used to be on Effexor, but currently is not taking any medications.  No depressed mood. -Observe closely  Tobacco abuse: -Did counseling about importance of quitting smoking -Nicotine patch  GERD (gastroesophageal reflux disease): -prn Protonix  COPD: Patient has mild dry cough, no shortness of breath. No wheezing or rhonchi on auscultation.  No COPD exacerbation. -Scheduled Atrovent and as needed albuterol -As needed Mucinex  Fall  -PT/OT when able to  [facility] heart valve: Patient carried this diagnosis in medical problem list.  No 2D echo on record.  Not sure which type of valvular disease.  Patient does not have leg edema, no JVD, no crackles on auscultation. BNP 15.4. No signs of CHF.   -Observe closely for any signs of volume overload  Diabetes mellitus without complication ([place]): Recent A1c 7.1.  Patient used to be on Metformin, but she states that she is not taking his medications currently.  Blood sugar 248. -Start  sliding scale insulin  Positive pregnancy test: hCG quantitative level is 17. Patient  States that there is no chance she could be pregnant, and she has previously had an endometrial ablation. -preg test needs to be repeated in one week     Perioperative Cardiac Risk: pt has multiple comorbidities, including diabetes, COPD and valvular disease. Currently patient is active and independent of ADLs and, IADLs. Patient does not have chest pain, shortness of breath, palpitation, leg edema.  No signs of acute CHF.  EKG has no acute change.  COPD is stable.  No oxygen desaturation.  Chest x-ray negative.  At this time point, no further work up is needed. Patient's GUPTA score perioperative myocardial infarction or cardaic arrest is 0.43 %, which is low risk for surgery. She can proceed for surgery.  Thank you for this consultation.  Our [facility] hospitalist team will follow the patient with you.   Time Spent:  30 min  Ivor Costa M.D. [facility] 03/21/2020, 5:48 PM

## 2020-03-21 NOTE — Plan of Care (Signed)
  Problem: Activity: Goal: Risk for activity intolerance will decrease Outcome: Progressing   Problem: Nutrition: Goal: Adequate nutrition will be maintained Outcome: Progressing   Problem: Coping: Goal: Level of anxiety will decrease Outcome: Progressing   Problem: Health Behavior/Discharge Planning: Goal: Ability to manage health-related needs will improve Outcome: Progressing

## 2020-03-21 NOTE — ED Notes (Signed)
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 Denies hitting head or loc. Obvious deformity noted to right leg. Alert and oriented.

## 2020-03-21 NOTE — ED Notes (Signed)
Pt given ice water as requested, ok per dr Tamala Julian

## 2020-03-21 NOTE — H&P (Addendum)
PREOPERATIVE H&P  Chief Complaint: Right distal Tibial shaft Fracture  HPI: Ashley Powers is a 50 y.o. female who presents for preoperative history and physical with a diagnosis of Right Tibial Fracture.  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  Patient had pain and deformity after landing on her right leg.  Patient was brought to the [facility] emergency department by EMS and was diagnosed with a displaced bimalleolar fracture of the right tibia.  Patient is being admitted to the orthopedic service for definitive management of the patient's fracture.  Patient seen in the ED with her mother at the bedside.  Past Medical History:  Diagnosis Date  . Arthritis   . Depression   . Emphysema of lung ([place])   . GERD (gastroesophageal reflux disease)   . History of chicken pox   . History of drug abuse ([place])    about 56yrs ago - smoking crack  . Leaky heart valve    needs ABT before dental procedures   Past Surgical History:  Procedure Laterality Date  . TUBAL LIGATION  2001   Social History   Socioeconomic History  . Marital status: Widowed    Spouse name: Not on file  . Number of children: 2  . Years of education: Not on file  . Highest education level: Not on file  Occupational History  . Occupation: Solicitor    Comment: Banker  Tobacco Use  . Smoking status: Current Every Day Smoker    Packs/day: 1.00    Years: 24.00    Pack years: 24.00    Types: Cigarettes  . Smokeless tobacco: Never Used  Substance and Sexual Activity  . Alcohol use: Yes    Alcohol/week: 2.0 standard drinks    Types: 2 Glasses of wine per week  . Drug use: Yes    Comment: marijuana occasionally   . Sexual activity: Not on file  Other Topics Concern  . Not on file  Social History Narrative   Ashley Powers grew up in [place], [place]. She is married and has two children Ashley Powers, Ashley Powers). 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 Hobbies:  reading.      Caffeine - 1 cup of coffee   Exercise - none at present   Social Determinants of Health   Financial Resource Strain: Not on file  Food Insecurity: Not on file  Transportation Needs: Not on file  Physical Activity: Not on file  Stress: Not on file  Social Connections: Not on file   Family History  Problem Relation Age of Onset  . Arthritis Mother   . Cancer Mother        melanoma  . Heart disease Father        4 heart attacks. Died age 75  . Diabetes Father   . Diabetes Paternal Aunt   . Alcohol abuse Maternal Grandfather   . Arthritis Paternal Grandmother   . Alcohol abuse Paternal Grandfather    No Known Allergies Prior to Admission medications   Medication Sig Start Date End Date Taking? Authorizing Provider  ibuprofen (ADVIL,MOTRIN) 200 MG tablet Take 200 mg by mouth every 6 (six) hours as needed.    [provider]  metFORMIN (GLUCOPHAGE-XR) 500 MG 24 hr tablet Take 500 mg by mouth daily with supper. 12/01/19   [provider]  venlafaxine XR (EFFEXOR-XR) 37.5 MG 24 hr capsule Take 37.5 mg by mouth daily. 12/01/19   [provider]  Positive ROS: All other systems have been reviewed and were otherwise negative with the exception of those mentioned in the HPI and as above.  Physical Exam: General: Alert, no acute distress Cardiovascular: Regular rate and rhythm, no murmurs rubs or gallops.  No pedal edema Respiratory: Clear to auscultation bilaterally, no wheezes rales or rhonchi. No cyanosis, no use of accessory musculature GI: No organomegaly, abdomen is soft and non-tender nondistended with positive bowel sounds. Skin: Skin intact, no lesions within the operative field. Neurologic: Sensation intact distally Psychiatric: Patient is competent for consent with normal mood and affect Lymphatic: No cervical lymphadenopathy  MUSCULOSKELETAL: Right lower extremity: Patient has intact skin but ecchymosis forming around the  fracture site.  She has mild to moderate swelling but her compartments are soft and compressible.  She can flex and extend her toes dorsiflex and plantarflex her ankle gently.  She has palpable pedal pulses and her foot is well-perfused.  There is a slight puckering of the skin near the fracture site but no skin compromise.  The skin is closed.  Assessment: Right displaced spiral tibial Fracture  Plan: Plan for Procedure(s): INTRAMEDULLARY (IM) NAIL TIBIAL  I reviewed the details of the operation as well as the postoperative course with the patient and her mother.  I reviewed the labs drawn in the ER.  Patient will have a hospitalist consult for preop clearance.  I discussed the risks and benefits of surgery. The risks include but are not limited to infection, bleeding requiring blood transfusion, nerve or blood vessel injury, joint stiffness or loss of motion, persistent pain, weakness or instability, malunion, nonunion and hardware failure and the need for further surgery. Medical risks include but are not limited to DVT and pulmonary embolism, myocardial infarction, stroke, pneumonia, respiratory failure and death.  Patient understands that she has an increased risk for nonunion given her history of smoking.  Patient understood these risks and wished to proceed.     Thornton Park, MD   03/21/2020 3:58 PM

## 2020-03-21 NOTE — ED Provider Notes (Signed)
Procedures     ----------------------------------------- 4:23 PM on 03/21/2020 ----------------------------------------- Splint in place, normal distal perfusion.   hCG quantitative level is 17, patient confirms that there is no chance she could be pregnant, and she has previously had an endometrial ablation.  EKG performed, interpreted by me  Date: 03/21/2020  Rate: 73  Rhythm: normal sinus rhythm  QRS Axis: normal  Intervals: normal  ST/T Wave abnormalities: normal  Conduction Disutrbances: none  Narrative Interpretation: unremarkable         Carrie Mew, MD 03/21/20 1657

## 2020-03-21 NOTE — ED Triage Notes (Signed)
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 Denies injury to head. A/O x 4.

## 2020-03-21 NOTE — ED Provider Notes (Signed)
[facility] Emergency Department Provider Note  ____________________________________________   Event Date/Time   First MD Initiated Contact with Patient 03/21/20 1351     (approximate)  I have reviewed the triage vital signs and the nursing notes.   HISTORY  Chief Complaint Leg Injury   HPI Ashley Powers is a 50 y.o. 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  Patient states that she fell about 3 feet and landed on her right leg funny felt immediate pain in her right leg.  She did not hit her head or injure anywhere else.  She specifically denies any headache, neck pain, back pain, chest pain abdominal pain, left lower extremity pain, right knee pain, right hip pain or upper extremity pain.  She is otherwise in her usual state of health without any recent fevers, chills, cough, nausea, vomiting, diarrhea, dysuria, rash or any other acute sick symptoms.  No other acute concerns at this time.         Past Medical History:  Diagnosis Date  . Arthritis   . Depression   . Emphysema of lung ([place])   . GERD (gastroesophageal reflux disease)   . History of chicken pox   . History of drug abuse ([place])    about 32yrs ago - smoking crack  . Leaky heart valve    needs ABT before dental procedures    Patient Active Problem List   Diagnosis Date Noted  . Displaced spiral fracture of shaft of right tibia, initial encounter for closed fracture 03/21/2020  . Smokes tobacco daily 12/16/2014  . Major depressive disorder, single episode, mild ([place]) 12/16/2014  . Depression 06/28/2013    Past Surgical History:  Procedure Laterality Date  . TUBAL LIGATION  2001    Prior to Admission medications   Medication Sig Start Date End Date Taking? Authorizing Provider  ibuprofen (ADVIL,MOTRIN) 200 MG tablet Take 200 mg by mouth  every 6 (six) hours as needed.    [provider]  metFORMIN (GLUCOPHAGE-XR) 500 MG 24 hr tablet Take 500 mg by mouth daily with supper. 12/01/19   [provider]  venlafaxine XR (EFFEXOR-XR) 37.5 MG 24 hr capsule Take 37.5 mg by mouth daily. 12/01/19   [provider]    Allergies Patient has no known allergies.  Family History  Problem Relation Age of Onset  . Arthritis Mother   . Cancer Mother        melanoma  . Heart disease Father        4 heart attacks. Died age 5  . Diabetes Father   . Diabetes Paternal Aunt   . Alcohol abuse Maternal Grandfather   . Arthritis Paternal Grandmother   . Alcohol abuse Paternal Grandfather     Social History Social History   Tobacco Use  . Smoking status: Current Every Day Smoker    Packs/day: 1.00    Years: 24.00    Pack years: 24.00    Types: Cigarettes  . Smokeless tobacco: Never Used  Substance Use Topics  . Alcohol use: Yes    Alcohol/week: 2.0 standard drinks    Types: 2 Glasses of wine per week  . Drug use: Yes    Comment: marijuana occasionally     Review of Systems  Review of Systems  Constitutional: Negative for chills and fever.  HENT: Negative for sore  throat.   Eyes: Negative for pain.  Respiratory: Negative for cough and stridor.   Cardiovascular: Negative for chest pain.  Gastrointestinal: Negative for vomiting.  Genitourinary: Negative for dysuria.  Musculoskeletal: Positive for joint pain ( R ankle) and myalgias ( R lower leg).  Skin: Negative for rash.  Neurological: Negative for seizures, loss of consciousness and headaches.  Psychiatric/Behavioral: Negative for suicidal ideas.  All other systems reviewed and are negative.     ____________________________________________   PHYSICAL EXAM:  VITAL SIGNS: ED Triage Vitals  Enc Vitals Group     BP      Pulse      Resp      Temp      Temp src      SpO2      Weight      Height      Head Circumference      Peak Flow       Pain Score      Pain Loc      Pain Edu?      Excl. in [place]?    Vitals:   03/21/20 1352  BP: (!) 145/69  Pulse: 78  Resp: 20  Temp: 97.9 F (36.6 C)  SpO2: 96%   Physical Exam Vitals and nursing note reviewed.  Constitutional:      General: She is not in acute distress.    Appearance: She is well-developed.  HENT:     Head: Normocephalic and atraumatic.     Right Ear: External ear normal.     Left Ear: External ear normal.     Nose: Nose normal.  Eyes:     Conjunctiva/sclera: Conjunctivae normal.  Cardiovascular:     Rate and Rhythm: Normal rate and regular rhythm.     Heart sounds: No murmur heard.   Pulmonary:     Effort: Pulmonary effort is normal. No respiratory distress.  Abdominal:     Palpations: Abdomen is soft.     Tenderness: There is no abdominal tenderness.  Musculoskeletal:     Cervical back: Neck supple.  Skin:    General: Skin is warm and dry.  Neurological:     Mental Status: She is alert and oriented to person, place, and time.  Psychiatric:        Mood and Affect: Mood normal.     2+ bilateral DP pulses.  Patient has decreased drink at the right ankle and deformity on the medial aspect with some surrounding bruising right above the ankle.  No other obvious trauma to the right lower extremity although patient is significantly weak on dorsi and plantarflexion of the ankle.  She is able to move her toes on command.  She does have full strength at the hips and throughout the left lower leg. ____________________________________________   LABS (all labs ordered are listed, but only abnormal results are displayed)  Labs Reviewed  CBC WITH DIFFERENTIAL/PLATELET - Abnormal; Notable for the following components:      Result Value   WBC 11.0 (*)    All other components within normal limits  BASIC METABOLIC PANEL - Abnormal; Notable for the following components:   CO2 20 (*)    Glucose, Bld 248 (*)    Calcium 8.8 (*)    All other components within  normal limits  HCG, QUANTITATIVE, PREGNANCY - Abnormal; Notable for the following components:   hCG, Beta Chain, Quant, S 17 (*)    All other components within normal limits  RESP PANEL BY RT-PCR (FLU  A&B, COVID) ARPGX2  URINE CULTURE  PROTIME-INR  HIV ANTIBODY (ROUTINE TESTING W REFLEX)  APTT  HEMOGLOBIN A1C  POC URINE PREG, ED  TYPE AND SCREEN   ____________________________________________  EKG  ____________________________________________  RADIOLOGY  ED MD interpretation: Chest x-ray shows no full consolidation, effusion, edema or other clear acute intrathoracic process.  Plain film of the right tib-fib shows spiral tibial fracture with some displacement and a fibular fracture.  Official radiology report(s): DG Chest 1 View  Result Date: 03/21/2020 CLINICAL DATA:  Preop. History of emphysema. Smoker. Leaky heart valve. EXAM: CHEST  1 VIEW COMPARISON:  None. FINDINGS: The heart size and mediastinal contours are within normal limits. Both lungs are clear. The visualized skeletal structures are unremarkable. IMPRESSION: No active disease. Electronically Signed   By: Kerby Moors M.D.   On: 03/21/2020 15:23   DG Tibia/Fibula Right  Result Date: 03/21/2020 CLINICAL DATA:  Fall EXAM: RIGHT TIBIA AND FIBULA - 2 VIEW COMPARISON:  Right ankle 08/29/2017 FINDINGS: Mild displaced fracture proximal right fibula. Spiral fracture with 1/2 shaft width displacement of the mid to distal tibia. Normal knee and ankle joints. IMPRESSION: Fracture of the right tibia and fibula. Electronically Signed   By: Franchot Gallo M.D.   On: 03/21/2020 14:50    ____________________________________________   PROCEDURES  Procedure(s) performed (including Critical Care):  .1-3 Lead EKG Interpretation Performed by: Lucrezia Starch, MD Authorized by: Lucrezia Starch, MD     Interpretation: normal     ECG rate assessment: normal     Rhythm: sinus rhythm     Ectopy: none     Conduction: normal        ____________________________________________   INITIAL IMPRESSION / ASSESSMENT AND PLAN / ED COURSE      Patient presents with [place] to history exam for assessment of right lower leg pain that began after she had a mechanical ground-level fall as described above.  On arrival she is afebrile and hemodynamically stable.  She is able to plantar dorsiflex her right foot but able to wiggle her toes on command has a 2+ radial pulse.  Sensation is intact to light touch throughout the right lower foot.  Patient's knee is unremarkable.  No other evidence of trauma elsewhere about the right lower extremity.  She adamantly denies any other injuries and Evalose patient for other traumatic process at this time.  X-ray does show evidence of a spiral displaced tibial fracture in the fibula fracture.  Discussed with on-call orthopedist Dr. Mack Guise who will admit the patient for likely operative repair tomorrow.  Routine preop labs chest x-ray and EKG ordered.  Chest x-ray is unremarkable.  CBC shows leukocytosis likely reactive in the setting of trauma with WBC count of 11 and normal hemoglobin and platelets.  BMP shows glucose of 248 with no other significant electrolyte or metabolic derangements.  hCG is 17.  Suspect this may be baseline although may require follow-up with consulting hospitalist.  Covid is negative.  INR is unremarkable.  Covid is negative.  Patient given below noted analgesia and placed in long-leg posterior splint.  Sliding scale insulin ordered for hyperglycemia.  She will be admitted to orthopedic service for further evaluation and management.      ____________________________________________   FINAL CLINICAL IMPRESSION(S) / ED DIAGNOSES  Final diagnoses:  Closed fracture of distal end of right fibula, unspecified fracture morphology, initial encounter  Closed displaced spiral fracture of shaft of right tibia, initial encounter  Hyperglycemia    Medications  nicotine  (NICODERM CQ - dosed in mg/24 hours) patch 21 mg (21 mg Transdermal Patch Applied 03/21/20 1541)  0.9 %  sodium chloride infusion (has no administration in time range)  oxyCODONE (Oxy IR/ROXICODONE) immediate release tablet 5-10 mg (has no administration in time range)  HYDROmorphone (DILAUDID) injection 0.5 mg (has no administration in time range)  methocarbamol (ROBAXIN) tablet 500 mg (has no administration in time range)    Or  methocarbamol (ROBAXIN) 500 mg in dextrose 5 % 50 mL IVPB (has no administration in time range)  docusate sodium (COLACE) capsule 100 mg (has no administration in time range)  senna-docusate (Senokot-S) tablet 1 tablet (has no administration in time range)  bisacodyl (DULCOLAX) suppository 10 mg (has no administration in time range)  magnesium citrate solution 1 Bottle (has no administration in time range)  insulin aspart (novoLOG) injection 0-15 Units (has no administration in time range)  fentaNYL (SUBLIMAZE) injection 50 mcg (50 mcg Intravenous Given 03/21/20 1357)  HYDROmorphone (DILAUDID) injection 1 mg (1 mg Intravenous Given 03/21/20 1514)     ED Discharge Orders    None       Note:  This document was prepared using Dragon voice recognition software and may include unintentional dictation errors.   Lucrezia Starch, MD 03/21/20 [phone]

## 2020-03-22 ENCOUNTER — Encounter: Payer: Self-pay | Admitting: Orthopedic Surgery

## 2020-03-22 ENCOUNTER — Inpatient Hospital Stay: Payer: Worker's Compensation

## 2020-03-22 ENCOUNTER — Encounter
Admission: EM | Disposition: A | Discharge status: Home / Self Care | Payer: Self-pay | Source: Home / Self Care | Attending: Hospitalist

## 2020-03-22 ENCOUNTER — Inpatient Hospital Stay: Payer: Worker's Compensation | Admitting: Certified Registered Nurse Anesthetist

## 2020-03-22 DIAGNOSIS — S82401A Unspecified fracture of shaft of right fibula, initial encounter for closed fracture: Secondary | ICD-10-CM

## 2020-03-22 DIAGNOSIS — S82201A Unspecified fracture of shaft of right tibia, initial encounter for closed fracture: Secondary | ICD-10-CM

## 2020-03-22 HISTORY — PX: TIBIA IM NAIL INSERTION: SHX2516

## 2020-03-22 LAB — BASIC METABOLIC PANEL
Anion gap: 6 (ref 5–15)
BUN: 12 mg/dL (ref 6–20)
CO2: 30 mmol/L (ref 22–32)
Calcium: 8.6 mg/dL — ABNORMAL LOW (ref 8.9–10.3)
Chloride: 103 mmol/L (ref 98–111)
Creatinine, Ser: 0.63 mg/dL (ref 0.44–1.00)
GFR, Estimated: >60 mL/min (ref >60)
Glucose, Bld: 160 mg/dL — ABNORMAL HIGH (ref 70–99)
Potassium: 3.8 mmol/L (ref 3.5–5.1)
Sodium: 139 mmol/L (ref 135–145)

## 2020-03-22 LAB — CBC
HCT: 39.2 % (ref 36.0–46.0)
Hemoglobin: 12.8 g/dL (ref 12.0–15.0)
MCH: 32.4 pg (ref 26.0–34.0)
MCHC: 32.7 g/dL (ref 30.0–36.0)
MCV: 99.2 fL (ref 80.0–100.0)
Platelets: 240 10*3/uL (ref 150–400)
RBC: 3.95 MIL/uL (ref 3.87–5.11)
RDW: 13 % (ref 11.5–15.5)
WBC: 11.3 10*3/uL — ABNORMAL HIGH (ref 4.0–10.5)
nRBC: 0 % (ref 0.0–0.2)

## 2020-03-22 LAB — GLUCOSE, CAPILLARY
Glucose-Capillary: 153 mg/dL — ABNORMAL HIGH (ref 70–99)
Glucose-Capillary: 132 mg/dL — ABNORMAL HIGH (ref 70–99)
Glucose-Capillary: 204 mg/dL — ABNORMAL HIGH (ref 70–99)

## 2020-03-22 SURGERY — INSERTION, INTRAMEDULLARY ROD, TIBIA
Anesthesia: General | Laterality: Right

## 2020-03-22 MED ORDER — NEOMYCIN-POLYMYXIN B GU 40-200000 IR SOLN
Status: DC | PRN
  Administered 2020-03-22: 4 mL

## 2020-03-22 MED ORDER — LIDOCAINE HCL (PF) 2 % IJ SOLN
INTRAMUSCULAR | Status: AC
  Filled 2020-03-22: qty 5

## 2020-03-22 MED ORDER — ACETAMINOPHEN 10 MG/ML IV SOLN
INTRAVENOUS | Status: DC | PRN
  Administered 2020-03-22: 1000 mg via INTRAVENOUS

## 2020-03-22 MED ORDER — ONDANSETRON HCL 4 MG PO TABS: 4.0000 mg | ORAL_TABLET | Freq: Four times a day (QID) | ORAL | Status: DC | PRN

## 2020-03-22 MED ORDER — HYDROMORPHONE HCL 1 MG/ML IJ SOLN
0.5000 mg | INTRAMUSCULAR | Status: DC | PRN
Start: 2020-03-22 — End: 2020-03-23
  Administered 2020-03-22 – 2020-03-23 (×3): 1 mg via INTRAVENOUS
  Filled 2020-03-22 (×3): qty 1

## 2020-03-22 MED ORDER — FENTANYL CITRATE (PF) 100 MCG/2ML IJ SOLN
INTRAMUSCULAR | Status: AC
  Administered 2020-03-22: 50 ug via INTRAVENOUS
  Filled 2020-03-22: qty 2

## 2020-03-22 MED ORDER — FENTANYL CITRATE (PF) 100 MCG/2ML IJ SOLN
INTRAMUSCULAR | Status: AC
  Filled 2020-03-22: qty 2

## 2020-03-22 MED ORDER — MAGNESIUM CITRATE PO SOLN
1.0000 | Freq: Once | ORAL | Status: DC | PRN
  Filled 2020-03-22: qty 296

## 2020-03-22 MED ORDER — HYDROMORPHONE HCL 1 MG/ML IJ SOLN
INTRAMUSCULAR | Status: AC
  Administered 2020-03-22: 0.5 mg via INTRAVENOUS
  Filled 2020-03-22: qty 1

## 2020-03-22 MED ORDER — FENTANYL CITRATE (PF) 100 MCG/2ML IJ SOLN
INTRAMUSCULAR | Status: DC | PRN
  Administered 2020-03-22 (×2): 50 ug via INTRAVENOUS
  Administered 2020-03-22: 100 ug via INTRAVENOUS

## 2020-03-22 MED ORDER — LIDOCAINE HCL (CARDIAC) PF 100 MG/5ML IV SOSY
PREFILLED_SYRINGE | INTRAVENOUS | Status: DC | PRN
  Administered 2020-03-22: 100 mg via INTRAVENOUS

## 2020-03-22 MED ORDER — NICOTINE 21 MG/24HR TD PT24
21.0000 mg | MEDICATED_PATCH | Freq: Every day | TRANSDERMAL | Status: DC
  Administered 2020-03-22: 21 mg via TRANSDERMAL
  Filled 2020-03-22 (×2): qty 1

## 2020-03-22 MED ORDER — FENTANYL CITRATE (PF) 100 MCG/2ML IJ SOLN
25.0000 ug | INTRAMUSCULAR | Status: DC | PRN
Start: 2020-03-22 — End: 2020-03-22
  Administered 2020-03-22 (×2): 50 ug via INTRAVENOUS

## 2020-03-22 MED ORDER — OXYCODONE-ACETAMINOPHEN 5-325 MG PO TABS
2.0000 | ORAL_TABLET | ORAL | Status: DC | PRN
Start: 2020-03-22 — End: 2020-03-22
  Administered 2020-03-22 (×2): 2 via ORAL
  Filled 2020-03-22 (×2): qty 2

## 2020-03-22 MED ORDER — OXYCODONE HCL 5 MG/5ML PO SOLN: 5.0000 mg | Freq: Once | ORAL | Status: DC | PRN

## 2020-03-22 MED ORDER — ACETAMINOPHEN 500 MG PO TABS
1000.0000 mg | ORAL_TABLET | Freq: Four times a day (QID) | ORAL | Status: DC
  Administered 2020-03-22: 1000 mg via ORAL
  Filled 2020-03-22 (×2): qty 2

## 2020-03-22 MED ORDER — DOCUSATE SODIUM 100 MG PO CAPS
100.0000 mg | ORAL_CAPSULE | Freq: Two times a day (BID) | ORAL | Status: DC
  Administered 2020-03-22 – 2020-03-23 (×2): 100 mg via ORAL
  Filled 2020-03-22 (×2): qty 1

## 2020-03-22 MED ORDER — OXYCODONE HCL 5 MG PO TABS
10.0000 mg | ORAL_TABLET | ORAL | Status: DC | PRN
Start: 2020-03-22 — End: 2020-03-23
  Administered 2020-03-22: 10 mg via ORAL
  Administered 2020-03-23 (×4): 15 mg via ORAL
  Filled 2020-03-22 (×4): qty 3

## 2020-03-22 MED ORDER — OXYCODONE HCL 5 MG PO TABS: 5.0000 mg | ORAL_TABLET | Freq: Once | ORAL | Status: DC | PRN

## 2020-03-22 MED ORDER — ALUM & MAG HYDROXIDE-SIMETH 200-200-20 MG/5ML PO SUSP: 30.0000 mL | ORAL | Status: DC | PRN

## 2020-03-22 MED ORDER — MIDAZOLAM HCL 2 MG/2ML IJ SOLN
INTRAMUSCULAR | Status: AC
  Filled 2020-03-22: qty 2

## 2020-03-22 MED ORDER — IPRATROPIUM-ALBUTEROL 0.5-2.5 (3) MG/3ML IN SOLN
RESPIRATORY_TRACT | Status: AC
  Administered 2020-03-22: 3 mL via RESPIRATORY_TRACT
  Filled 2020-03-22: qty 3

## 2020-03-22 MED ORDER — CEFAZOLIN SODIUM-DEXTROSE 2-4 GM/100ML-% IV SOLN
2.0000 g | INTRAVENOUS | Status: AC
  Administered 2020-03-22: 2 g via INTRAVENOUS

## 2020-03-22 MED ORDER — ENOXAPARIN SODIUM 40 MG/0.4ML ~~LOC~~ SOLN
40.0000 mg | SUBCUTANEOUS | Status: DC
  Administered 2020-03-23: 40 mg via SUBCUTANEOUS
  Filled 2020-03-22: qty 0.4

## 2020-03-22 MED ORDER — CEFAZOLIN SODIUM-DEXTROSE 1-4 GM/50ML-% IV SOLN
1.0000 g | Freq: Four times a day (QID) | INTRAVENOUS | Status: AC
  Administered 2020-03-22 – 2020-03-23 (×2): 1 g via INTRAVENOUS
  Filled 2020-03-22 (×2): qty 50

## 2020-03-22 MED ORDER — HYDROMORPHONE HCL 1 MG/ML IJ SOLN
0.5000 mg | INTRAMUSCULAR | Status: DC | PRN
  Administered 2020-03-22: 0.5 mg via INTRAVENOUS

## 2020-03-22 MED ORDER — ONDANSETRON HCL 4 MG/2ML IJ SOLN
INTRAMUSCULAR | Status: AC
  Filled 2020-03-22: qty 2

## 2020-03-22 MED ORDER — DEXAMETHASONE SODIUM PHOSPHATE 10 MG/ML IJ SOLN
INTRAMUSCULAR | Status: AC
  Filled 2020-03-22: qty 1

## 2020-03-22 MED ORDER — TRAMADOL HCL 50 MG PO TABS
50.0000 mg | ORAL_TABLET | Freq: Four times a day (QID) | ORAL | Status: DC
  Administered 2020-03-23: 50 mg via ORAL
  Filled 2020-03-22: qty 1

## 2020-03-22 MED ORDER — MENTHOL 3 MG MT LOZG
1.0000 | LOZENGE | OROMUCOSAL | Status: DC | PRN
  Filled 2020-03-22: qty 9

## 2020-03-22 MED ORDER — PHENOL 1.4 % MT LIQD
1.0000 | OROMUCOSAL | Status: DC | PRN
  Filled 2020-03-22: qty 177

## 2020-03-22 MED ORDER — DEXMEDETOMIDINE (PRECEDEX) IN NS 20 MCG/5ML (4 MCG/ML) IV SYRINGE
PREFILLED_SYRINGE | INTRAVENOUS | Status: DC | PRN
  Administered 2020-03-22: 4 ug via INTRAVENOUS
  Administered 2020-03-22: 8 ug via INTRAVENOUS

## 2020-03-22 MED ORDER — OXYCODONE HCL 5 MG PO TABS
5.0000 mg | ORAL_TABLET | ORAL | Status: DC | PRN
Start: 2020-03-22 — End: 2020-03-23
  Filled 2020-03-22: qty 2

## 2020-03-22 MED ORDER — ACETAMINOPHEN 10 MG/ML IV SOLN
INTRAVENOUS | Status: AC
  Filled 2020-03-22: qty 100

## 2020-03-22 MED ORDER — ONDANSETRON HCL 4 MG/2ML IJ SOLN: 4.0000 mg | Freq: Four times a day (QID) | INTRAMUSCULAR | Status: DC | PRN

## 2020-03-22 MED ORDER — METHOCARBAMOL 1000 MG/10ML IJ SOLN
500.0000 mg | Freq: Four times a day (QID) | INTRAVENOUS | Status: DC | PRN
  Filled 2020-03-22: qty 5

## 2020-03-22 MED ORDER — ROCURONIUM BROMIDE 10 MG/ML (PF) SYRINGE
PREFILLED_SYRINGE | INTRAVENOUS | Status: AC
  Filled 2020-03-22: qty 10

## 2020-03-22 MED ORDER — SUGAMMADEX SODIUM 200 MG/2ML IV SOLN
INTRAVENOUS | Status: DC | PRN
  Administered 2020-03-22: 200 mg via INTRAVENOUS

## 2020-03-22 MED ORDER — PROPOFOL 10 MG/ML IV BOLUS
INTRAVENOUS | Status: DC | PRN
  Administered 2020-03-22: 130 mg via INTRAVENOUS
  Administered 2020-03-22: 70 mg via INTRAVENOUS

## 2020-03-22 MED ORDER — ONDANSETRON HCL 4 MG/2ML IJ SOLN
INTRAMUSCULAR | Status: DC | PRN
  Administered 2020-03-22: 4 mg via INTRAVENOUS

## 2020-03-22 MED ORDER — IPRATROPIUM-ALBUTEROL 0.5-2.5 (3) MG/3ML IN SOLN: 3.0000 mL | Freq: Once | RESPIRATORY_TRACT | Status: AC

## 2020-03-22 MED ORDER — METHOCARBAMOL 500 MG PO TABS
500.0000 mg | ORAL_TABLET | Freq: Four times a day (QID) | ORAL | Status: DC | PRN
  Administered 2020-03-23 (×2): 500 mg via ORAL
  Filled 2020-03-22 (×2): qty 1

## 2020-03-22 MED ORDER — PROPOFOL 10 MG/ML IV BOLUS
INTRAVENOUS | Status: AC
  Filled 2020-03-22: qty 20

## 2020-03-22 MED ORDER — DEXAMETHASONE SODIUM PHOSPHATE 10 MG/ML IJ SOLN
INTRAMUSCULAR | Status: DC | PRN
  Administered 2020-03-22: 5 mg via INTRAVENOUS

## 2020-03-22 MED ORDER — ROCURONIUM BROMIDE 100 MG/10ML IV SOLN
INTRAVENOUS | Status: DC | PRN
  Administered 2020-03-22: 40 mg via INTRAVENOUS
  Administered 2020-03-22: 10 mg via INTRAVENOUS
  Administered 2020-03-22: 20 mg via INTRAVENOUS

## 2020-03-22 MED ORDER — NICOTINE POLACRILEX 2 MG MT GUM
2.0000 mg | CHEWING_GUM | OROMUCOSAL | Status: DC | PRN
  Administered 2020-03-22 – 2020-03-23 (×2): 2 mg via ORAL
  Filled 2020-03-22 (×4): qty 1

## 2020-03-22 SURGICAL SUPPLY — 61 items
BIT DRILL 3.8X6 NS (BIT) ×1 IMPLANT
BIT DRILL 4.4 NS (BIT) ×1 IMPLANT
BIT DRILL CAL 3.2 LONG (BIT) ×2 IMPLANT
BNDG COHESIVE 3X5 TAN STRL LF (GAUZE/BANDAGES/DRESSINGS) ×1 IMPLANT
BNDG ELASTIC 4X5.8 VLCR NS LF (GAUZE/BANDAGES/DRESSINGS) ×3 IMPLANT
BNDG ELASTIC 4X5.8 VLCR STR LF (GAUZE/BANDAGES/DRESSINGS) ×1 IMPLANT
BNDG ELASTIC 6X5.8 VLCR NS LF (GAUZE/BANDAGES/DRESSINGS) ×3 IMPLANT
BNDG ELASTIC 6X5.8 VLCR STR LF (GAUZE/BANDAGES/DRESSINGS) ×1 IMPLANT
CAST PADDING 6X4YD ST 30248 (SOFTGOODS) ×1
COVER WAND RF STERILE (DRAPES) ×1 IMPLANT
CUFF TOURN SGL QUICK 24 (TOURNIQUET CUFF)
CUFF TOURN SGL QUICK 30 (TOURNIQUET CUFF) ×2
CUFF TRNQT CYL 24X4X16.5-23 (TOURNIQUET CUFF) IMPLANT
CUFF TRNQT CYL 30X4X21-28X (TOURNIQUET CUFF) IMPLANT
DRAPE 3/4 80X56 (DRAPES) ×4 IMPLANT
DRAPE C-ARM XRAY 36X54 (DRAPES) ×2 IMPLANT
DRAPE C-ARMOR (DRAPES) ×2 IMPLANT
DURAPREP 26ML APPLICATOR (WOUND CARE) ×3 IMPLANT
ELECT REM PT RETURN 9FT ADLT (ELECTROSURGICAL) ×2
ELECTRODE REM PT RTRN 9FT ADLT (ELECTROSURGICAL) ×1 IMPLANT
GAUZE SPONGE 4X4 12PLY STRL (GAUZE/BANDAGES/DRESSINGS) ×2 IMPLANT
GAUZE XEROFORM 1X8 LF (GAUZE/BANDAGES/DRESSINGS) ×2 IMPLANT
GLOVE SURG 9.0 ORTHO LTXF (GLOVE) ×4 IMPLANT
GLOVE SURG UNDER POLY LF SZ9 (GLOVE) ×2 IMPLANT
GOWN STRL REUS TWL 2XL XL LVL4 (GOWN DISPOSABLE) ×2 IMPLANT
GOWN STRL REUS W/ TWL LRG LVL3 (GOWN DISPOSABLE) ×1 IMPLANT
GOWN STRL REUS W/TWL LRG LVL3 (GOWN DISPOSABLE) ×2
GUIDEPIN 3.2X17.5 THRD DISP (PIN) ×1 IMPLANT
GUIDEWIRE BALL NOSE 80CM (WIRE) ×1 IMPLANT
HEMOVAC 400CC 10FR (MISCELLANEOUS) ×2 IMPLANT
KIT TURNOVER KIT A (KITS) ×2 IMPLANT
MANIFOLD NEPTUNE II (INSTRUMENTS) ×2 IMPLANT
MAT ABSORB  FLUID 56X50 GRAY (MISCELLANEOUS)
MAT ABSORB FLUID 56X50 GRAY (MISCELLANEOUS) IMPLANT
NAIL TIBIAL 10MMX31.5CM (Nail) ×1 IMPLANT
NDL FILTER BLUNT 18X1 1/2 (NEEDLE) ×1 IMPLANT
NEEDLE FILTER BLUNT 18X 1/2SAF (NEEDLE) ×1
NEEDLE FILTER BLUNT 18X1 1/2 (NEEDLE) ×1 IMPLANT
NS IRRIG 1000ML POUR BTL (IV SOLUTION) ×2 IMPLANT
PACK TOTAL KNEE (MISCELLANEOUS) ×2 IMPLANT
PAD ABD DERMACEA PRESS 5X9 (GAUZE/BANDAGES/DRESSINGS) ×1 IMPLANT
PADDING CAST 6X4YD NS (MISCELLANEOUS) ×2
PADDING CAST BLEND 4X4 NS (MISCELLANEOUS) ×4 IMPLANT
PADDING CAST COTTON 6X4 NS (MISCELLANEOUS) ×2 IMPLANT
PADDING CAST COTTON 6X4 ST (SOFTGOODS) IMPLANT
REAMER ROD DEEP FLUTE 2.5X950 (INSTRUMENTS) ×2 IMPLANT
SCREW ACECAP 28MM (Screw) ×1 IMPLANT
SCREW ACECAP 34MM (Screw) ×1 IMPLANT
SCREW PROXIMAL DEPUY (Screw) ×6 IMPLANT
SCREW PRXML FT 40X5.5XNS LF (Screw) IMPLANT
SCREW PRXML FT 45X5.5XLCK NS (Screw) IMPLANT
SPLINT CAST 1 STEP 4X30 (MISCELLANEOUS) ×4 IMPLANT
STAPLER SKIN PROX 35W (STAPLE) ×2 IMPLANT
SUT ETHILON 4-0 (SUTURE) ×4
SUT ETHILON 4-0 FS2 18XMFL BLK (SUTURE) ×2
SUT MNCRL AB 3-0 PS2 27 (SUTURE) ×2 IMPLANT
SUT VIC AB 0 CT2 27 (SUTURE) ×2 IMPLANT
SUT VIC AB 2-0 CT2 27 (SUTURE) ×2 IMPLANT
SUTURE ETHLN 4-0 FS2 18XMF BLK (SUTURE) ×1 IMPLANT
SYR 10ML LL (SYRINGE) ×2 IMPLANT
TOWEL OR 17X26 4PK STRL BLUE (TOWEL DISPOSABLE) ×1 IMPLANT

## 2020-03-22 NOTE — Transfer of Care (Signed)
Immediate Anesthesia Transfer of Care Note  Patient: Ashley Powers  Procedure(s) Performed: INTRAMEDULLARY (IM) NAIL TIBIAL (Right )  Patient Location: PACU  Anesthesia Type:General  Level of Consciousness: awake, drowsy and patient cooperative  Airway & Oxygen Therapy: Patient Spontanous Breathing and Patient connected to face mask oxygen  Post-op Assessment: Report given to RN and Post -op Vital signs reviewed and stable  Post vital signs: Reviewed and stable.  Duo-Neb for pt in PACU d/t Wheezing.  Last Vitals:  Vitals Value Taken Time  BP 158/61 03/22/20 1815  Temp 36.7 C 03/22/20 1815  Pulse 98 03/22/20 1828  Resp 25 03/22/20 1828  SpO2 100 % 03/22/20 1828  Vitals shown include unvalidated device data.  Last Pain:  Vitals:   03/22/20 1815  TempSrc:   PainSc: Asleep      Patients Stated Pain Goal: 0 (98/06/99 9672)  Complications: No complications documented.

## 2020-03-22 NOTE — Anesthesia Preprocedure Evaluation (Addendum)
Anesthesia Evaluation  Patient identified by MRN, date of birth, ID band Patient awake    Reviewed: Allergy & Precautions, H&P , NPO status , Patient's Chart, lab work & pertinent test results  Airway Mallampati: III  TM Distance: >3 FB Neck ROM: full    Dental  (+) Chipped, Poor Dentition, Missing, Loose   Pulmonary neg shortness of breath, COPD, Current Smoker and Patient abstained from smoking.,    Pulmonary exam normal        Cardiovascular Exercise Tolerance: Good negative cardio ROS Normal cardiovascular exam     Neuro/Psych PSYCHIATRIC DISORDERS negative neurological ROS     GI/Hepatic Neg liver ROS, GERD  Medicated and Controlled,  Endo/Other  diabetes, Type 2  Renal/GU      Musculoskeletal  (+) Arthritis ,   Abdominal   Peds  Hematology negative hematology ROS (+)   Anesthesia Other Findings Past Medical History: No date: Arthritis No date: COPD (chronic obstructive pulmonary disease) ([facility]) No date: Depression No date: Emphysema of lung ([facility]) No date: GERD (gastroesophageal reflux disease) No date: History of chicken pox No date: History of drug abuse ([place])     Comment:  about 46yrs ago - smoking crack No date: Leaky heart valve     Comment:  needs ABT before dental procedures No date: Tobacco abuse  Past Surgical History: 2001: TUBAL LIGATION  BMI    Body Mass Index: 23.85 kg/m      Reproductive/Obstetrics                            Anesthesia Physical Anesthesia Plan  ASA: III  Anesthesia Plan: General ETT   Post-op Pain Management:    Induction: Intravenous  PONV Risk Score and Plan: Ondansetron, Dexamethasone, Midazolam and Treatment may vary due to age or medical condition  Airway Management Planned: Oral ETT  Additional Equipment:   Intra-op Plan:   Post-operative Plan: Extubation in OR  Informed Consent: I have reviewed the patients History and  Physical, chart, labs and discussed the procedure including the risks, benefits and alternatives for the proposed anesthesia with the patient or authorized representative who has indicated his/her understanding and acceptance.     Dental Advisory Given  Plan Discussed with: Anesthesiologist, CRNA and Surgeon  Anesthesia Plan Comments: (Patients serum beta hCG quantitative level is 17. Patient states that there is no chance she could be pregnant, and she has previously had an endometrial ablation.  After discussing possible risks to fetus from anesthesia the patient voices consent and would like to proceed with a general anesthetic.  Patient consented for risks of anesthesia including but not limited to:  - adverse reactions to medications - damage to eyes, teeth, lips or other oral mucosa - nerve damage due to positioning  - sore throat or hoarseness - Damage to heart, brain, nerves, lungs, other parts of body or loss of life  Patient voiced understanding.)       Anesthesia Quick Evaluation

## 2020-03-22 NOTE — Plan of Care (Signed)
  Problem: Health Behavior/Discharge Planning: Goal: Ability to manage health-related needs will improve 03/22/2020 0751 by Jules Schick, RN Outcome: Progressing 03/21/2020 1813 by Jules Schick, RN Outcome: Progressing   Problem: Activity: Goal: Risk for activity intolerance will decrease 03/22/2020 0751 by Jules Schick, RN Outcome: Progressing 03/21/2020 1813 by Jules Schick, RN Outcome: Progressing   Problem: Nutrition: Goal: Adequate nutrition will be maintained 03/22/2020 0751 by Jules Schick, RN Outcome: Progressing 03/21/2020 1813 by Jules Schick, RN Outcome: Progressing   Problem: Coping: Goal: Level of anxiety will decrease 03/22/2020 0751 by Jules Schick, RN Outcome: Progressing 03/21/2020 1813 by Jules Schick, RN Outcome: Progressing

## 2020-03-22 NOTE — Op Note (Signed)
DATE OF SURGERY:  03/22/2020  TIME: 6:16 PM  PATIENT NAME:  Ashley Powers  AGE: 50 y.o.  PRE-OPERATIVE DIAGNOSIS: Displaced spiral fracture of the right tibial shaft  POST-OPERATIVE DIAGNOSIS:  SAME  PROCEDURE:  INTRAMEDULLARY (IM) FIXATION OF RIGHT TIBIAL SHAFT FRACTURE  SURGEON:  Thornton Park   EBL:  20 cc  TOURNIQUET TIME:  85 minutes  OPERATIVE IMPLANTS: Biomet Versanail 10 x 31.5, 2 x 40 mm proximal interlocking screw with 28 and 34 mm distal interlocking screws  PREOPERATIVE INDICATIONS:  LITITIA SEN is a 50 y.o. year old who fell and suffered a right tibia fracture. She was brought into the ER and then admitted to orthopaedics for evaluation and management of the tibia fracture.    The risks, benefits and alternatives were discussed with the patient and their family.  The risks include but are not limited to infection, bleeding, nerve or blood vessel injury, malunion, nonunion, hardware prominence, hardware failure, change in leg lengths or lower extremity rotation need for further surgery. Medical risks include but are not limited to DVT and pulmonary embolism, myocardial infarction, stroke, pneumonia, respiratory failure and death. The patient understood these risks and wished to proceed with surgery.  OPERATIVE PROCEDURE:  The patient was brought to the operating room and placed in the supine position on the radiolucent OR table. General anesthesia was administered.  The patient was prepped and draped in a sterile fashion.  A closed reduction was performed using a tibial distractor under C-arm guidance.  The fracture reduction was confirmed on both AP and lateral views. A time out was performed to verify the patient's name, date of birth, medical record number, correct site of surgery correct procedure to be performed. It was also used to verify the patient received antibiotics and that appropriate instruments, implants and radiographic studies were available in the room. Once  all in attendance were in agreement, the case began.  Patient received 2 g of Ancef prior to the onset of the case.  Tourniquet was applied to the upper right thigh and inflated to 250 mmHg for total of 85 minutes.  A mid line incision was made extending from the tibial tubercle to the inferior pole of the patella. A threaded guidepin was then inserted into the proximal tibia with a drill.  The threaded guidepin was removed and replaced with a ball tip guidewire which was advanced down the tibial shaft and across the fracture site into the distal tibia. Position of this ball tip guidewire was confirmed on AP and lateral C-arm images. Sequential reamers were then used over the ball-tipped guidewire until sufficient cortical chatter was achieved. A nail diameter 1 mm smaller than the last reamer was selected.  The diameter of the nail was 10 mm.  The depth of the guidewire was measured to determine the appropriate length of the nail which was 31.5. The nail was then assembled onto the inserting jig and advanced into position within the tibial shaft. Its final position was confirmed on AP and lateral C-arm images both proximally and distally.   Once the nail was completely seated, the drill guide for the proximal interlocking screw was placed through the guide arm for the Versanail.  The drill was placed through the lateral static hole, drilled bicortically and measured for depth.  A second proximal interlocking screw was placed through the oblique distal hole medially.  It too was drilled bicortically and measured for depth.  The corresponding screw was advanced by hand into position  and final C-arm images were taken.  The nail insertion arm was then removed.    The attention was then turned to the distal interlocking screws.  These were placed using a perfect circle technique. Once the position of the distal interlocking screw hole was identified with C-arm, a small stab incision was made to allow the drill to  approximate the medial cortex of the tibia. The drill for the interlocking screw was then advanced bicortically. The diameter of the tibia was measured through this drill hole using a depth gauge. The interlocking screws were advanced into position by hand. Once both interlocking screws were in position, final C-arm images of the intramedullary construct were taken in both the AP and lateral planes including the proximal and distal ends of the tibia as well as the fracture site. The insertion arm for the tibial nail was then removed.  The wounds were irrigated copiously and closed with 0 Vicryl for closure of the deep fascia and 2-0 Vicryl for his subcutaneous closure. The anterior tibial incision was approximated with staples.  The stab incisions for the interlocking screws were closed with 4-0 nylon.  A dry sterile dressing was applied. An AO splint was also applied to the operative leg.  I was scrubbed and present the entire case and all sharp, sponge and instrument counts were correct at the conclusion of the case. Patient was transferred to hospital bed and brought to PACU in stable condition.  The tourniquet was deflated at 85 minutes.   Timoteo Gaul, MD

## 2020-03-22 NOTE — Progress Notes (Signed)
Subjective:  Patient awaiting surgery.  OR states earliest available time is 4 pm today.  Right leg pain is moderate.  Splint in place.    Objective:   VITALS:   Vitals:   03/21/20 2323 03/22/20 0434 03/22/20 0756 03/22/20 1125  BP: 133/64 (!) 149/69 (!) 141/72 (!) 142/68  Pulse: 71 73 70 70  Resp: 18 17 16 16   Temp: 97.7 F (36.5 C) 97.6 F (36.4 C) 98.3 F (36.8 C) (!) 97.2 F (36.2 C)  TempSrc:      SpO2: 99% 98% 98% 96%  Weight:      Height:        PHYSICAL EXAM: Right lower extremity Neurovascular intact Sensation intact distally Intact pulses distally Patient can flex and extend toes Compartment soft  LABS  Results for orders placed or performed during the hospital encounter of 03/21/20 (from the past 24 hour(s))  CBC with Differential     Status: Abnormal   Collection Time: 03/21/20  1:59 PM  Result Value Ref Range   WBC 11.0 (H) 4.0 - 10.5 K/uL   RBC 4.55 3.87 - 5.11 MIL/uL   Hemoglobin 14.9 12.0 - 15.0 g/dL   HCT 44.4 36.0 - 46.0 %   MCV 97.6 80.0 - 100.0 fL   MCH 32.7 26.0 - 34.0 pg   MCHC 33.6 30.0 - 36.0 g/dL   RDW 12.8 11.5 - 15.5 %   Platelets 257 150 - 400 K/uL   nRBC 0.0 0.0 - 0.2 %   Neutrophils Relative % 61 %   Neutro Abs 6.7 1.7 - 7.7 K/uL   Lymphocytes Relative 30 %   Lymphs Abs 3.3 0.7 - 4.0 K/uL   Monocytes Relative 7 %   Monocytes Absolute 0.8 0.1 - 1.0 K/uL   Eosinophils Relative 1 %   Eosinophils Absolute 0.1 0.0 - 0.5 K/uL   Basophils Relative 1 %   Basophils Absolute 0.1 0.0 - 0.1 K/uL   Immature Granulocytes 0 %   Abs Immature Granulocytes 0.04 0.00 - 0.07 K/uL  Basic metabolic panel     Status: Abnormal   Collection Time: 03/21/20  1:59 PM  Result Value Ref Range   Sodium 135 135 - 145 mmol/L   Potassium 4.0 3.5 - 5.1 mmol/L   Chloride 102 98 - 111 mmol/L   CO2 20 (L) 22 - 32 mmol/L   Glucose, Bld 248 (H) 70 - 99 mg/dL   BUN 13 6 - 20 mg/dL   Creatinine, Ser 0.72 0.44 - 1.00 mg/dL   Calcium 8.8 (L) 8.9 - 10.3 mg/dL    GFR, Estimated >60 >60 mL/min   Anion gap 13 5 - 15  hCG, quantitative, pregnancy     Status: Abnormal   Collection Time: 03/21/20  1:59 PM  Result Value Ref Range   hCG, Beta Chain, Quant, S 17 (H) <5 mIU/mL  Resp Panel by RT-PCR (Flu A&B, Covid) Nasopharyngeal Swab     Status: None   Collection Time: 03/21/20  1:59 PM   Specimen: Nasopharyngeal Swab; Nasopharyngeal(NP) swabs in vial transport medium  Result Value Ref Range   SARS Coronavirus 2 by RT PCR NEGATIVE NEGATIVE   Influenza A by PCR NEGATIVE NEGATIVE   Influenza B by PCR NEGATIVE NEGATIVE  Brain natriuretic peptide     Status: None   Collection Time: 03/21/20  1:59 PM  Result Value Ref Range   B Natriuretic Peptide 15.4 0.0 - 100.0 pg/mL  Protime-INR     Status: None  Collection Time: 03/21/20  3:02 PM  Result Value Ref Range   Prothrombin Time 12.7 11.4 - 15.2 seconds   INR 1.0 0.8 - 1.2  Type and screen Ordered by ORDER LAB     Status: None   Collection Time: 03/21/20  3:02 PM  Result Value Ref Range   ABO/RH(D) O POS    Antibody Screen NEG    Sample Expiration      03/24/2020,2359 Performed at [facility], [address] [place], [place] [address]   APTT     Status: None   Collection Time: 03/21/20  3:02 PM  Result Value Ref Range   aPTT 29 24 - 36 seconds  Glucose, capillary     Status: Abnormal   Collection Time: 03/21/20  4:57 PM  Result Value Ref Range   Glucose-Capillary 167 (H) 70 - 99 mg/dL  Urine Drug Screen, Qualitative ([facility] only)     Status: Abnormal   Collection Time: 03/21/20  5:00 PM  Result Value Ref Range   Tricyclic, Ur Screen NONE DETECTED NONE DETECTED   Amphetamines, Ur Screen NONE DETECTED NONE DETECTED   MDMA (Ecstasy)Ur Screen NONE DETECTED NONE DETECTED   Cocaine Metabolite,Ur [place] NONE DETECTED NONE DETECTED   Opiate, Ur Screen POSITIVE (A) NONE DETECTED   Phencyclidine (PCP) Ur S NONE DETECTED NONE DETECTED   Cannabinoid 50 Ng, Ur [place] POSITIVE (A) NONE DETECTED    Barbiturates, Ur Screen NONE DETECTED NONE DETECTED   Benzodiazepine, Ur Scrn NONE DETECTED NONE DETECTED   Methadone Scn, Ur NONE DETECTED NONE DETECTED  Surgical PCR screen     Status: None   Collection Time: 03/21/20  6:27 PM   Specimen: Nasal Mucosa; Nasal Swab  Result Value Ref Range   MRSA, PCR NEGATIVE NEGATIVE   Staphylococcus aureus NEGATIVE NEGATIVE  Glucose, capillary     Status: Abnormal   Collection Time: 03/21/20  8:13 PM  Result Value Ref Range   Glucose-Capillary 182 (H) 70 - 99 mg/dL  CBC     Status: Abnormal   Collection Time: 03/22/20  6:29 AM  Result Value Ref Range   WBC 11.3 (H) 4.0 - 10.5 K/uL   RBC 3.95 3.87 - 5.11 MIL/uL   Hemoglobin 12.8 12.0 - 15.0 g/dL   HCT 39.2 36.0 - 46.0 %   MCV 99.2 80.0 - 100.0 fL   MCH 32.4 26.0 - 34.0 pg   MCHC 32.7 30.0 - 36.0 g/dL   RDW 13.0 11.5 - 15.5 %   Platelets 240 150 - 400 K/uL   nRBC 0.0 0.0 - 0.2 %  Basic metabolic panel     Status: Abnormal   Collection Time: 03/22/20  6:29 AM  Result Value Ref Range   Sodium 139 135 - 145 mmol/L   Potassium 3.8 3.5 - 5.1 mmol/L   Chloride 103 98 - 111 mmol/L   CO2 30 22 - 32 mmol/L   Glucose, Bld 160 (H) 70 - 99 mg/dL   BUN 12 6 - 20 mg/dL   Creatinine, Ser 0.63 0.44 - 1.00 mg/dL   Calcium 8.6 (L) 8.9 - 10.3 mg/dL   GFR, Estimated >60 >60 mL/min   Anion gap 6 5 - 15  Glucose, capillary     Status: Abnormal   Collection Time: 03/22/20  7:57 AM  Result Value Ref Range   Glucose-Capillary 153 (H) 70 - 99 mg/dL   Comment 1 Notify RN    Comment 2 Document in Chart   Glucose, capillary  Status: Abnormal   Collection Time: 03/22/20 11:28 AM  Result Value Ref Range   Glucose-Capillary 132 (H) 70 - 99 mg/dL   Comment 1 Notify RN    Comment 2 Document in Chart     DG Chest 1 View  Result Date: 03/21/2020 CLINICAL DATA:  Preop. History of emphysema. Smoker. Leaky heart valve. EXAM: CHEST  1 VIEW COMPARISON:  None. FINDINGS: The heart size and mediastinal contours are  within normal limits. Both lungs are clear. The visualized skeletal structures are unremarkable. IMPRESSION: No active disease. Electronically Signed   By: Kerby Moors M.D.   On: 03/21/2020 15:23   DG Tibia/Fibula Right  Result Date: 03/21/2020 CLINICAL DATA:  Fall EXAM: RIGHT TIBIA AND FIBULA - 2 VIEW COMPARISON:  Right ankle 08/29/2017 FINDINGS: Mild displaced fracture proximal right fibula. Spiral fracture with 1/2 shaft width displacement of the mid to distal tibia. Normal knee and ankle joints. IMPRESSION: Fracture of the right tibia and fibula. Electronically Signed   By: Franchot Gallo M.D.   On: 03/21/2020 14:50    Assessment/Plan:     Principal Problem:   Closed fracture of right tibia and fibula Active Problems:   Depression   Positive pregnancy test   Tobacco abuse   GERD (gastroesophageal reflux disease)   Leaky heart valve   Diabetes mellitus without complication (HCC)   COPD (chronic obstructive pulmonary disease) ([place])   Fall   Continue NPO and no anticoagulation until after surgery.  Continue elevation.   Thornton Park , MD 03/22/2020, 1:25 PM

## 2020-03-22 NOTE — Anesthesia Procedure Notes (Signed)
Procedure Name: Intubation Date/Time: 03/22/2020 4:15 PM Performed by: Lowry Bowl, CRNA Pre-anesthesia Checklist: Patient identified, Emergency Drugs available, Suction available and Patient being monitored Patient Re-evaluated:Patient Re-evaluated prior to induction Oxygen Delivery Method: Circle system utilized Preoxygenation: Pre-oxygenation with 100% oxygen Induction Type: IV induction Ventilation: Mask ventilation without difficulty Laryngoscope Size: 3 and McGraph Grade View: Grade I Tube type: Oral Tube size: 7.0 mm Number of attempts: 1 Airway Equipment and Method: Stylet and Video-laryngoscopy Placement Confirmation: ETT inserted through vocal cords under direct vision,  positive ETCO2 and breath sounds checked- equal and bilateral Secured at: 21 cm Tube secured with: Tape Dental Injury: Teeth and Oropharynx as per pre-operative assessment

## 2020-03-22 NOTE — Anesthesia Postprocedure Evaluation (Signed)
Anesthesia Post Note  Patient: Ashley Powers  Procedure(s) Performed: INTRAMEDULLARY (IM) NAIL TIBIAL (Right )  Patient location during evaluation: PACU Anesthesia Type: General Level of consciousness: awake and alert Pain management: pain level controlled Vital Signs Assessment: post-procedure vital signs reviewed and stable Respiratory status: spontaneous breathing, nonlabored ventilation, respiratory function stable and patient connected to nasal cannula oxygen Cardiovascular status: blood pressure returned to baseline and stable Postop Assessment: no apparent nausea or vomiting Anesthetic complications: no   No complications documented.   Last Vitals:  Vitals:   03/22/20 1923 03/22/20 1955  BP: (!) 156/72 (!) 161/72  Pulse: 79 87  Resp: 16 17  Temp: 37.1 C 36.6 C  SpO2: 97% 98%    Last Pain:  Vitals:   03/22/20 1955  TempSrc: Oral  PainSc:                  Ashley Powers

## 2020-03-22 NOTE — Plan of Care (Signed)
°  Problem: Coping: °Goal: Level of anxiety will decrease °Outcome: Progressing °  °

## 2020-03-22 NOTE — Progress Notes (Addendum)
[place] DAILY PROGRESS NOTE    Ashley Powers  JME:[id number] DOB: 05-13-1970 DOA: 03/21/2020 PCP: Patient, No Pcp Per  [facility]/None   Assessment & Plan:   Principal Problem:   Closed fracture of right tibia and fibula Active Problems:   Depression   Positive pregnancy test   Tobacco abuse   GERD (gastroesophageal reflux disease)   Leaky heart valve   Diabetes mellitus without complication ([place])   COPD (chronic obstructive pulmonary disease) ([place])   Fall   Ashley Powers is an 50 y.o. female with a past medical history of depression, diabetes mellitus, emphysema, tobacco abuse, GERD, leaky heart valve, who is admitted by Dr. Erie Noe due to right tibial fracture.    Closed fracture of right tibia and fibula:  As evidenced by x-ray.  No neurovascular compromise.  --OR with Dr. Mack Guise today - Pain control: dilaudid prn and percocet - Robaxin for muscle spasm  Leukocytosis: WBC 11.0. Likely due to stress-induced demargination. Patient does not have signs of infection. -Follow-up CBC  Depression:  Patient used to be on Effexor, but currently is not taking any medications.  No depressed mood. -Observe closely  Tobacco abuse: -Did counseling about importance of quitting smoking -Nicotine patch --nicotine gum  GERD (gastroesophageal reflux disease): -prn Protonix  COPD: Patient has mild dry cough, no shortness of breath. No wheezing or rhonchi on auscultation.  No COPD exacerbation. -Scheduled Atrovent and as needed albuterol -As needed Mucinex  Fall accidental  Leaky heart valve: Patient carried this diagnosis in medical problem list.  No 2D echo on record.  Not sure which type of valvular disease.  Patient does not have leg edema, no JVD, no crackles on auscultation. BNP 15.4. No signs of CHF.   -Observe closely for any signs of volume overload  Diabetes mellitus without complication ([place]): Recent A1c 7.1.  Patient used to be on Metformin, but she states  that she is not taking his medications currently.  Blood sugar 248. --SSI --hold metformin while inpatient  Positive pregnancy test: hCG quantitative level is 17. Patient  States that there is no chance she could be pregnant, and she has previously had an endometrial ablation. -preg test needs to be repeated in one week    DVT prophylaxis: SCD/Compression stockings Code Status: Full code  Family Communication:  Level of care: Med-Surg Dispo:   The patient is from: home Anticipated d/c is to: undetermined Anticipated d/c date is: 1-2 days Patient currently is not medically ready to d/c due to: pending surgery   Subjective and Interval History:  Pt complained of uncontrolled pain, so Percocet increased to 2 tablets, which helped.  Also needed more nicotine replacement.   Objective: Vitals:   03/22/20 1819 03/22/20 1830 03/22/20 1848 03/22/20 1900  BP:  (!) 175/81 (!) 181/81 (!) 156/66  Pulse: 89 (!) 101 90 86  Resp: 16 16 13 13   Temp:    98.9 F (37.2 C)  TempSrc:      SpO2: 91% 100% 99% 100%  Weight:      Height:        Intake/Output Summary (Last 24 hours) at 03/22/2020 1917 Last data filed at 03/22/2020 1815 Gross per 24 hour  Intake 1000 ml  Output 820 ml  Net 180 ml   Filed Weights   03/21/20 1353 03/22/20 1511  Weight: 65 kg 65 kg    Examination:   Constitutional: NAD, AAOx3 HEENT: conjunctivae and lids normal, EOMI CV: No cyanosis.   RESP: normal respiratory  effort, on RA Extremities: No effusions, edema in BLE SKIN: warm, dry Neuro: II - XII grossly intact.   Psych: Normal mood and affect.  Appropriate judgement and reason   Data Reviewed: I have personally reviewed following labs and imaging studies  CBC: Recent Labs  Lab 03/21/20 1359 03/22/20 0629  WBC 11.0* 11.3*  NEUTROABS 6.7  --   HGB 14.9 12.8  HCT 44.4 39.2  MCV 97.6 99.2  PLT 257 638   Basic Metabolic Panel: Recent Labs  Lab 03/21/20 1359 03/22/20 0629  NA 135 139  K  4.0 3.8  CL 102 103  CO2 20* 30  GLUCOSE 248* 160*  BUN 13 12  CREATININE 0.72 0.63  CALCIUM 8.8* 8.6*   GFR: Estimated Creatinine Clearance: 78.2 mL/min (by C-G formula based on SCr of 0.63 mg/dL). Liver Function Tests: No results for input(s): AST, ALT, ALKPHOS, BILITOT, PROT, ALBUMIN in the last 168 hours. No results for input(s): LIPASE, AMYLASE in the last 168 hours. No results for input(s): AMMONIA in the last 168 hours. Coagulation Profile: Recent Labs  Lab 03/21/20 1502  INR 1.0   Cardiac Enzymes: No results for input(s): CKTOTAL, CKMB, CKMBINDEX, TROPONINI in the last 168 hours. BNP (last 3 results) No results for input(s): PROBNP in the last 8760 hours. HbA1C: No results for input(s): HGBA1C in the last 72 hours. CBG: Recent Labs  Lab 03/21/20 1657 03/21/20 2013 03/22/20 0757 03/22/20 1128  GLUCAP 167* 182* 153* 132*   Lipid Profile: No results for input(s): CHOL, HDL, LDLCALC, TRIG, CHOLHDL, LDLDIRECT in the last 72 hours. Thyroid Function Tests: No results for input(s): TSH, T4TOTAL, FREET4, T3FREE, THYROIDAB in the last 72 hours. Anemia Panel: No results for input(s): VITAMINB12, FOLATE, FERRITIN, TIBC, IRON, RETICCTPCT in the last 72 hours. Sepsis Labs: No results for input(s): PROCALCITON, LATICACIDVEN in the last 168 hours.  Recent Results (from the past 240 hour(s))  Resp Panel by RT-PCR (Flu A&B, Covid) Nasopharyngeal Swab     Status: None   Collection Time: 03/21/20  1:59 PM   Specimen: Nasopharyngeal Swab; Nasopharyngeal(NP) swabs in vial transport medium  Result Value Ref Range Status   SARS Coronavirus 2 by RT PCR NEGATIVE NEGATIVE Final    Comment: (NOTE) SARS-CoV-2 target nucleic acids are NOT DETECTED.  The SARS-CoV-2 RNA is generally detectable in upper respiratory specimens during the acute phase of infection. The lowest concentration of SARS-CoV-2 viral copies this assay can detect is 138 copies/mL. A negative result does not  preclude SARS-Cov-2 infection and should not be used as the sole basis for treatment or other patient management decisions. A negative result may occur with  improper specimen collection/handling, submission of specimen other than nasopharyngeal swab, presence of viral mutation(s) within the areas targeted by this assay, and inadequate number of viral copies(<138 copies/mL). A negative result must be combined with clinical observations, patient history, and epidemiological information. The expected result is Negative.  Fact Sheet for Patients:  EntrepreneurPulse.com.au  Fact Sheet for Healthcare Providers:  IncredibleEmployment.be  This test is no t yet approved or cleared by the [place] FDA and  has been authorized for detection and/or diagnosis of SARS-CoV-2 by FDA under an Emergency Use Authorization (EUA). This EUA will remain  in effect (meaning this test can be used) for the duration of the COVID-19 declaration under Section 564(b)(1) of the Act, 21 U.S.C.section 360bbb-3(b)(1), unless the authorization is terminated  or revoked sooner.       Influenza A by PCR NEGATIVE NEGATIVE  Final   Influenza B by PCR NEGATIVE NEGATIVE Final    Comment: (NOTE) The Xpert Xpress SARS-CoV-2/FLU/RSV plus assay is intended as an aid in the diagnosis of influenza from Nasopharyngeal swab specimens and should not be used as a sole basis for treatment. Nasal washings and aspirates are unacceptable for Xpert Xpress SARS-CoV-2/FLU/RSV testing.  Fact Sheet for Patients: EntrepreneurPulse.com.au  Fact Sheet for Healthcare Providers: IncredibleEmployment.be  This test is not yet approved or cleared by the [place] FDA and has been authorized for detection and/or diagnosis of SARS-CoV-2 by FDA under an Emergency Use Authorization (EUA). This EUA will remain in effect (meaning this test can be used) for the  duration of the COVID-19 declaration under Section 564(b)(1) of the Act, 21 U.S.C. section 360bbb-3(b)(1), unless the authorization is terminated or revoked.  Performed at [facility], [address], [place], [place] [address]   Surgical PCR screen     Status: None   Collection Time: 03/21/20  6:27 PM   Specimen: Nasal Mucosa; Nasal Swab  Result Value Ref Range Status   MRSA, PCR NEGATIVE NEGATIVE Final   Staphylococcus aureus NEGATIVE NEGATIVE Final    Comment: (NOTE) The Xpert SA Assay (FDA approved for NASAL specimens in patients 74 years of age and older), is one component of a comprehensive surveillance program. It is not intended to diagnose infection nor to guide or monitor treatment. Performed at [facility], [address], [place], [place] [address]       Radiology Studies: DG Chest 1 View  Result Date: 03/21/2020 CLINICAL DATA:  Preop. History of emphysema. Smoker. Leaky heart valve. EXAM: CHEST  1 VIEW COMPARISON:  None. FINDINGS: The heart size and mediastinal contours are within normal limits. Both lungs are clear. The visualized skeletal structures are unremarkable. IMPRESSION: No active disease. Electronically Signed   By: Kerby Moors M.D.   On: 03/21/2020 15:23   DG Tibia/Fibula Right  Result Date: 03/22/2020 CLINICAL DATA:  ORIF right tibia. EXAM: RIGHT TIBIA AND FIBULA - 2 VIEW; DG C-ARM 1-60 MIN COMPARISON:  Radiograph yesterday FINDINGS: Four fluoroscopic spot views of the right lower leg obtained in frontal and lateral projections. Intramedullary nail with proximal and distal locking screws traverse tibial shaft fracture. Fracture is in improved alignment. Proximal fibular fracture is again seen. Total fluoroscopy time 1 minutes 30 seconds. Dose 3.84 mGy. IMPRESSION: Intraoperative fluoroscopy during right tibial fracture ORIF. Electronically Signed   By: Keith Rake M.D.   On: 03/22/2020 18:16   DG Tibia/Fibula Right  Result  Date: 03/21/2020 CLINICAL DATA:  Fall EXAM: RIGHT TIBIA AND FIBULA - 2 VIEW COMPARISON:  Right ankle 08/29/2017 FINDINGS: Mild displaced fracture proximal right fibula. Spiral fracture with 1/2 shaft width displacement of the mid to distal tibia. Normal knee and ankle joints. IMPRESSION: Fracture of the right tibia and fibula. Electronically Signed   By: Franchot Gallo M.D.   On: 03/21/2020 14:50   DG C-Arm 1-60 Min  Result Date: 03/22/2020 CLINICAL DATA:  ORIF right tibia. EXAM: RIGHT TIBIA AND FIBULA - 2 VIEW; DG C-ARM 1-60 MIN COMPARISON:  Radiograph yesterday FINDINGS: Four fluoroscopic spot views of the right lower leg obtained in frontal and lateral projections. Intramedullary nail with proximal and distal locking screws traverse tibial shaft fracture. Fracture is in improved alignment. Proximal fibular fracture is again seen. Total fluoroscopy time 1 minutes 30 seconds. Dose 3.84 mGy. IMPRESSION: Intraoperative fluoroscopy during right tibial fracture ORIF. Electronically Signed   By: Keith Rake  M.D.   On: 03/22/2020 18:16     Scheduled Meds:  HYDROmorphone       [MAR Hold] insulin aspart  0-5 Units Subcutaneous QHS   [MAR Hold] insulin aspart  0-9 Units Subcutaneous TID WC   [MAR Hold] ipratropium  2 puff Inhalation Q6H   [MAR Hold] nicotine  21 mg Transdermal Daily   [MAR Hold] senna  1 tablet Oral BID   Continuous Infusions:  sodium chloride 75 mL/hr at 03/22/20 1602   [MAR Hold] methocarbamol (ROBAXIN) IV 500 mg (03/22/20 0850)     LOS: 1 day     Enzo Bi, MD [facility] If 7PM-7AM, please contact night-coverage 03/22/2020, 7:17 PM

## 2020-03-23 ENCOUNTER — Encounter: Payer: Self-pay | Admitting: Orthopedic Surgery

## 2020-03-23 LAB — CBC
HCT: 38.9 % (ref 36.0–46.0)
Hemoglobin: 12.7 g/dL (ref 12.0–15.0)
MCH: 32.5 pg (ref 26.0–34.0)
MCHC: 32.6 g/dL (ref 30.0–36.0)
MCV: 99.5 fL (ref 80.0–100.0)
Platelets: 238 10*3/uL (ref 150–400)
RBC: 3.91 MIL/uL (ref 3.87–5.11)
RDW: 12.9 % (ref 11.5–15.5)
WBC: 12.9 10*3/uL — ABNORMAL HIGH (ref 4.0–10.5)
nRBC: 0 % (ref 0.0–0.2)

## 2020-03-23 LAB — URINE CULTURE

## 2020-03-23 LAB — BASIC METABOLIC PANEL
Anion gap: 9 (ref 5–15)
BUN: 9 mg/dL (ref 6–20)
CO2: 25 mmol/L (ref 22–32)
Calcium: 8.9 mg/dL (ref 8.9–10.3)
Chloride: 102 mmol/L (ref 98–111)
Creatinine, Ser: 0.62 mg/dL (ref 0.44–1.00)
GFR, Estimated: >60 mL/min (ref >60)
Glucose, Bld: 168 mg/dL — ABNORMAL HIGH (ref 70–99)
Potassium: 4.2 mmol/L (ref 3.5–5.1)
Sodium: 136 mmol/L (ref 135–145)

## 2020-03-23 LAB — GLUCOSE, CAPILLARY
Glucose-Capillary: 143 mg/dL — ABNORMAL HIGH (ref 70–99)
Glucose-Capillary: 154 mg/dL — ABNORMAL HIGH (ref 70–99)
Glucose-Capillary: 149 mg/dL — ABNORMAL HIGH (ref 70–99)

## 2020-03-23 LAB — MAGNESIUM: Magnesium: 1.9 mg/dL (ref 1.7–2.4)

## 2020-03-23 MED ORDER — NICOTINE POLACRILEX 2 MG MT GUM: 2.0000 mg | CHEWING_GUM | OROMUCOSAL | 0 refills | Status: DC | PRN

## 2020-03-23 MED ORDER — DOCUSATE SODIUM 100 MG PO CAPS: 100.0000 mg | ORAL_CAPSULE | Freq: Two times a day (BID) | ORAL | 0 refills | Status: DC

## 2020-03-23 MED ORDER — ASPIRIN 325 MG PO TBEC: 325.0000 mg | DELAYED_RELEASE_TABLET | Freq: Two times a day (BID) | ORAL | 0 refills | Status: DC

## 2020-03-23 MED ORDER — NICOTINE 21 MG/24HR TD PT24: 21.0000 mg | MEDICATED_PATCH | Freq: Every day | TRANSDERMAL | 0 refills | Status: DC

## 2020-03-23 MED ORDER — OXYCODONE HCL 5 MG PO TABS: 5.0000 mg | ORAL_TABLET | ORAL | 0 refills | Status: DC | PRN

## 2020-03-23 MED ORDER — ASPIRIN EC 325 MG PO TBEC: 325.0000 mg | DELAYED_RELEASE_TABLET | Freq: Two times a day (BID) | ORAL | Status: DC

## 2020-03-23 NOTE — Plan of Care (Signed)
  Problem: Activity: Goal: Risk for activity intolerance will decrease Outcome: Progressing   Problem: Coping: Goal: Level of anxiety will decrease Outcome: Progressing   Problem: Activity: Goal: Risk for activity intolerance will decrease Outcome: Progressing

## 2020-03-23 NOTE — Plan of Care (Signed)
  Problem: Health Behavior/Discharge Planning: Goal: Ability to manage health-related needs will improve 03/23/2020 1838 by Trula Slade, RN Outcome: Adequate for Discharge 03/23/2020 1115 by Trula Slade, RN Outcome: Progressing   Problem: Activity: Goal: Risk for activity intolerance will decrease 03/23/2020 1838 by Trula Slade, RN Outcome: Adequate for Discharge 03/23/2020 1115 by Trula Slade, RN Outcome: Progressing   Problem: Nutrition: Goal: Adequate nutrition will be maintained 03/23/2020 1838 by Trula Slade, RN Outcome: Adequate for Discharge 03/23/2020 1115 by Trula Slade, RN Outcome: Progressing   Problem: Coping: Goal: Level of anxiety will decrease 03/23/2020 1838 by Trula Slade, RN Outcome: Adequate for Discharge 03/23/2020 1115 by Trula Slade, RN Outcome: Progressing

## 2020-03-23 NOTE — TOC Initial Note (Signed)
Transition of Care [facility]) - Initial/Assessment Note    Patient Details  Name: Ashley Powers MRN: [id number] Date of Birth: 30-Apr-1970  Transition of [facility]) CM/SW Contact:    Pete Pelt, RN Phone Number: 03/23/2020, 4:07 PM  Clinical Narrative:    TOC in to see patient, topic: discharge planning.  When inquiring about Worker's Therapist, occupational information to coordinate care, patient stated she did not wish to file a claim with Workers Compensation at this time.  CM advised that her medical costs may not be covered if she does not file, but we could not provide further recommendations.  Patient requested a list of all medications that were ordered for her in the Emergency Room, and all of the medications that she was given at that time. CM states that a request would need to be made through Medical Records for this informaiton.  TOC explained that discharge planning recommendations from team are home health for PT.  Patient states she refuses at this time, and that she can manage on her own.  Patient also states that she has DME at home and will not need any equipment.  TOC contact information given to patient in the event she would like home health prior to discharge.           Patient Goals and CMS Choice        Expected Discharge Plan and Services                                                Prior Living Arrangements/Services                       Activities of Daily Living Home Assistive Devices/Equipment: None ADL Screening (condition at time of admission) Patient's cognitive ability adequate to safely complete daily activities?: Yes Is the patient deaf or have difficulty hearing?: No Does the patient have difficulty seeing, even when wearing glasses/contacts?: No Does the patient have difficulty concentrating, remembering, or making decisions?: No Patient able to express need for assistance with ADLs?: No Does the patient have difficulty  dressing or bathing?: No Independently performs ADLs?: Yes (appropriate for developmental age) Does the patient have difficulty walking or climbing stairs?: No Weakness of Legs: None Weakness of Arms/Hands: None  Permission Sought/Granted                  Emotional Assessment              Admission diagnosis:  Hyperglycemia [R73.9] Closed fracture of right tibia [S82.201A] Closed displaced spiral fracture of shaft of right tibia, initial encounter [S82.241A] Displaced spiral fracture of shaft of right tibia, initial encounter for closed fracture [S82.241A] Closed fracture of distal end of right fibula, unspecified fracture morphology, initial encounter [M19.622W] Patient Active Problem List   Diagnosis Date Noted  . Displaced spiral fracture of shaft of right tibia, initial encounter for closed fracture 03/21/2020  . Positive pregnancy test 03/21/2020  . Closed fracture of right tibia and fibula 03/21/2020  . Fall 03/21/2020  . Tobacco abuse   . GERD (gastroesophageal reflux disease)   . Emphysema of lung ([place])   . Leaky heart valve   . Diabetes mellitus without complication ([place])   . COPD (chronic obstructive pulmonary disease) ([place])   . Smokes tobacco daily 12/16/2014  . Major depressive  disorder, single episode, mild ([place]) 12/16/2014  . Depression 06/28/2013   PCP:  Patient, No Pcp Per Pharmacy:   [id number] - [place], [place] - [address] [place] [address] Phone: [phone] Fax: [phone]     Social Determinants of Health (SDOH) Interventions    Readmission Risk Interventions No flowsheet data found.

## 2020-03-23 NOTE — Progress Notes (Signed)
Subjective:  POD #1 s/p intramedullary fixation for right tibia fracture.   Patient reports right leg pain as mild to moderate.  Patient is doing well today.  She is sitting up in bed.  Her pain is improved.  She feels that she is ready to go home.  Objective:   VITALS:   Vitals:   03/23/20 0240 03/23/20 0408 03/23/20 0753 03/23/20 1546  BP: (!) 149/71 130/66 (!) 154/73 (!) 142/70  Pulse: 86 95 82 81  Resp: 17 16 17 19   Temp: (!) 97.4 F (36.3 C) 98.2 F (36.8 C) 98 F (36.7 C) 98.3 F (36.8 C)  TempSrc: Oral Oral Oral   SpO2: 94% 95% 95% 97%  Weight:      Height:        PHYSICAL EXAM: Right lower extremity: Splint and dressings are clean and dry.  Patient can flex and extend her toes.  Her toes are well-perfused.  She has intact sensation light touch in all 5 toes and the first dorsal webspace. Neurovascular intact Sensation intact distally Compartment soft  LABS  Results for orders placed or performed during the hospital encounter of 03/21/20 (from the past 24 hour(s))  Glucose, capillary     Status: Abnormal   Collection Time: 03/22/20  9:23 PM  Result Value Ref Range   Glucose-Capillary 204 (H) 70 - 99 mg/dL   Comment 1 Notify RN   Basic metabolic panel     Status: Abnormal   Collection Time: 03/23/20  6:14 AM  Result Value Ref Range   Sodium 136 135 - 145 mmol/L   Potassium 4.2 3.5 - 5.1 mmol/L   Chloride 102 98 - 111 mmol/L   CO2 25 22 - 32 mmol/L   Glucose, Bld 168 (H) 70 - 99 mg/dL   BUN 9 6 - 20 mg/dL   Creatinine, Ser 0.62 0.44 - 1.00 mg/dL   Calcium 8.9 8.9 - 10.3 mg/dL   GFR, Estimated >60 >60 mL/min   Anion gap 9 5 - 15  CBC     Status: Abnormal   Collection Time: 03/23/20  6:14 AM  Result Value Ref Range   WBC 12.9 (H) 4.0 - 10.5 K/uL   RBC 3.91 3.87 - 5.11 MIL/uL   Hemoglobin 12.7 12.0 - 15.0 g/dL   HCT 38.9 36.0 - 46.0 %   MCV 99.5 80.0 - 100.0 fL   MCH 32.5 26.0 - 34.0 pg   MCHC 32.6 30.0 - 36.0 g/dL   RDW 12.9 11.5 - 15.5 %   Platelets  238 150 - 400 K/uL   nRBC 0.0 0.0 - 0.2 %  Magnesium     Status: None   Collection Time: 03/23/20  6:14 AM  Result Value Ref Range   Magnesium 1.9 1.7 - 2.4 mg/dL  Glucose, capillary     Status: Abnormal   Collection Time: 03/23/20  7:55 AM  Result Value Ref Range   Glucose-Capillary 143 (H) 70 - 99 mg/dL  Glucose, capillary     Status: Abnormal   Collection Time: 03/23/20 12:02 PM  Result Value Ref Range   Glucose-Capillary 154 (H) 70 - 99 mg/dL  Glucose, capillary     Status: Abnormal   Collection Time: 03/23/20  4:31 PM  Result Value Ref Range   Glucose-Capillary 149 (H) 70 - 99 mg/dL    DG Tibia/Fibula Right  Result Date: 03/22/2020 CLINICAL DATA:  Postop, tibial fracture EXAM: RIGHT TIBIA AND FIBULA - 2 VIEW COMPARISON:  03/21/2020 FINDINGS: Frontal and  lateral views of the right tibia and fibula are obtained. Intramedullary rod with proximal and distal interlocking screws traverses the oblique distal fibular diaphyseal fracture seen previously, with anatomic alignment. Oblique proximal fibular diaphyseal fracture is unchanged. The right knee and ankle are well aligned. Diffuse soft tissue edema. Overlying casting material. IMPRESSION: 1. ORIF of a distal tibial fracture with anatomic alignment. 2. Stable proximal fibular head fracture. Electronically Signed   By: Randa Ngo M.D.   On: 03/22/2020 19:37   DG Tibia/Fibula Right  Result Date: 03/22/2020 CLINICAL DATA:  ORIF right tibia. EXAM: RIGHT TIBIA AND FIBULA - 2 VIEW; DG C-ARM 1-60 MIN COMPARISON:  Radiograph yesterday FINDINGS: Four fluoroscopic spot views of the right lower leg obtained in frontal and lateral projections. Intramedullary nail with proximal and distal locking screws traverse tibial shaft fracture. Fracture is in improved alignment. Proximal fibular fracture is again seen. Total fluoroscopy time 1 minutes 30 seconds. Dose 3.84 mGy. IMPRESSION: Intraoperative fluoroscopy during right tibial fracture ORIF.  Electronically Signed   By: Keith Rake M.D.   On: 03/22/2020 18:16   DG C-Arm 1-60 Min  Result Date: 03/22/2020 CLINICAL DATA:  ORIF right tibia. EXAM: RIGHT TIBIA AND FIBULA - 2 VIEW; DG C-ARM 1-60 MIN COMPARISON:  Radiograph yesterday FINDINGS: Four fluoroscopic spot views of the right lower leg obtained in frontal and lateral projections. Intramedullary nail with proximal and distal locking screws traverse tibial shaft fracture. Fracture is in improved alignment. Proximal fibular fracture is again seen. Total fluoroscopy time 1 minutes 30 seconds. Dose 3.84 mGy. IMPRESSION: Intraoperative fluoroscopy during right tibial fracture ORIF. Electronically Signed   By: Keith Rake M.D.   On: 03/22/2020 18:16    Assessment/Plan: 1 Day Post-Op   Principal Problem:   Closed fracture of right tibia and fibula Active Problems:   Depression   Positive pregnancy test   Tobacco abuse   GERD (gastroesophageal reflux disease)   Leaky heart valve   Diabetes mellitus without complication ([facility])   COPD (chronic obstructive pulmonary disease) ([place])   Fall  Patient is doing well postop.  She will be discharged home today.  Patient will be toe-touch weightbearing only when she goes home.  She will use a walker for assistance with ambulation and continue strict elevation for the weekend.  She will follow-up in my office in approximately 7 to 10 days.  Patient should take enteric-coated aspirin 325 mg p.o. twice daily until follow-up for DVT prophylaxis.    Thornton Park , MD 03/23/2020, 5:54 PM

## 2020-03-23 NOTE — Evaluation (Signed)
Physical Therapy Evaluation Patient Details Name: Ashley Powers MRN: [id number] DOB: 1970/08/15 Today's Date: 03/23/2020   History of Present Illness  Patient is a 50 y.o. year old who fell and suffered a right tibia fracture. s/p IM nail right tibial shaft fracture.  Past medical history of depression, diabetes mellitus, emphysema, tobacco abuse, GERD, leaky heart valve    Clinical Impression  Patient agreeable to PT evaluation and reports she has already been getting out of bed to the bed side commode with nursing staff. Patient reports 8/10 pain but is mobilizing well with no physical assistance required with functional mobility. Multiple standing transfers performed with rolling walker and patient demonstrated safe technique and prefers to be NWB on RLE with all activity for comfort (weight bearing order is for WBAT). Patient ambulated a short distance with rolling walker with NWB and occasional TTWB of RLE without loss of balance. Patient is eager to be discharged home today if able. Recommend HHPT, rolling walker, and 3-in-1 for home use. From a physical therapy perspective, patient could be appropriate to discharge home today pending medical needs. PT will continue to follow if patient remains admitted to hospital.     Follow Up Recommendations Home health PT    Equipment Recommendations  Rolling walker with 5" wheels;3in1 (PT)    Recommendations for Other Services       Precautions / Restrictions Precautions Precautions: Fall Restrictions Weight Bearing Restrictions: Yes RLE Weight Bearing: Weight bearing as tolerated (per ortho order WBAT)      Mobility  Bed Mobility Overal bed mobility: Modified Independent                  Transfers Overall transfer level: Needs assistance Equipment used: Rolling walker (2 wheeled) Transfers: Stand Pivot Transfers;Sit to/from Stand Sit to Stand: Supervision Stand pivot transfers: Supervision       General transfer comment:  patient performed two stand pivot transfer from bed to and from bed side commode. sit to stand performed from bed with rolling walker. cues for RLE positioning with transferrs. no significant increased pain reported with transfers. patient prefers to maintain NWB of RLE with activity for comfort  Ambulation/Gait Ambulation/Gait assistance: Supervision Gait Distance (Feet): 25 Feet Assistive device: Rolling walker (2 wheeled) Gait Pattern/deviations: Decreased step length - left Gait velocity: decreased   General Gait Details: patient preferes to be mosyly NWB with occasional TTWB on RLE with ambulation for comfort (although order is for WBAT). patient has no loss of balance and is steady with short distance ambulation using rolling walker. correct technique used with rolling walker after initial demonstration and verbal cues for technique  Stairs Stairs:  (not required for home entry, patient has ramp in place)          Wheelchair Mobility    Modified Rankin (Stroke Patients Only)       Balance Overall balance assessment: Needs assistance Sitting-balance support: No upper extremity supported;Feet supported Sitting balance-Leahy Scale: Normal     Standing balance support: Bilateral upper extremity supported Standing balance-Leahy Scale: Fair Standing balance comment: patient uses rolling walker for UE support in standing                             Pertinent Vitals/Pain Pain Assessment: 0-10 Pain Score: 8  Pain Location: right leg, just distal to knee Pain Descriptors / Indicators: Discomfort Pain Intervention(s): Premedicated before session;Limited activity within patient's tolerance;Monitored during session;Ice applied  Home Living Family/patient expects to be discharged to:: Private residence Living Arrangements: Children (son) Available Help at Discharge: Family;Available 24 hours/day Type of Home: House Home Access: Ramped entrance     Home Layout:  One level Home Equipment: Shower seat      Prior Function Level of Independence: Independent         Comments: patient works at the [employer] and is independent with activity at [place]        Extremity/Trunk Assessment   Upper Extremity Assessment Upper Extremity Assessment: Overall WFL for tasks assessed    Lower Extremity Assessment Lower Extremity Assessment: RLE deficits/detail (LLW WFL for tasks assessed) RLE Deficits / Details: casting material covered with ace wrap noted. patient is able to activate toe movement, AROM knee flexion WFL but limited by pain. RLE: Unable to fully assess due to immobilization RLE Sensation: WNL       Communication   Communication: No difficulties  Cognition Arousal/Alertness: Awake/alert Behavior During Therapy: WFL for tasks assessed/performed                                          General Comments      Exercises     Assessment/Plan    PT Assessment Patient needs continued PT services  PT Problem List Decreased strength;Decreased range of motion;Decreased mobility;Decreased skin integrity;Decreased knowledge of use of DME;Decreased safety awareness       PT Treatment Interventions DME instruction;Gait training;Stair training;Functional mobility training;Therapeutic activities;Therapeutic exercise;Balance training    PT Goals (Current goals can be found in the Care Plan section)  Acute Rehab PT Goals Patient Stated Goal: to go home today PT Goal Formulation: With patient Time For Goal Achievement: 04/06/20 Potential to Achieve Goals: Good    Frequency 7X/week   Barriers to discharge        Co-evaluation               AM-PAC PT "6 Clicks" Mobility  Outcome Measure Help needed turning from your back to your side while in a flat bed without using bedrails?: None Help needed moving from lying on your back to sitting on the side of a flat bed without using  bedrails?: None Help needed moving to and from a bed to a chair (including a wheelchair)?: A Little Help needed standing up from a chair using your arms (e.g., wheelchair or bedside chair)?: A Little Help needed to walk in hospital room?: A Little Help needed climbing 3-5 steps with a railing? : A Little 6 Click Score: 20    End of Session   Activity Tolerance: Patient tolerated treatment well Patient left: in bed;with call bell/phone within reach;with bed alarm set (RLE elevated with pillows and ice pack applied for comfort) Nurse Communication: Mobility status PT Visit Diagnosis: Difficulty in walking, not elsewhere classified (R26.2);Pain    Time: 4650-3546 PT Time Calculation (min) (ACUTE ONLY): 32 min   Charges:   PT Evaluation $PT Eval Moderate Complexity: 1 Mod PT Treatments $Therapeutic Activity: 8-22 mins        Minna Merritts, PT, MPT  Percell Locus 03/23/2020, 9:45 AM

## 2020-03-23 NOTE — Discharge Summary (Signed)
Physician Discharge Summary  Patient ID: Ashley Powers MRN: [id number] DOB/AGE: [birth year] 50 y.o.  Admit date: 03/21/2020 Discharge date: 03/23/2020  Admission Diagnoses:  Tibial Fracture Closed fracture of right tibia and fibula  Discharge Diagnoses:  Tibial Fracture Principal Problem:   Closed fracture of right tibia and fibula Active Problems:   Depression   Positive pregnancy test   Tobacco abuse   GERD (gastroesophageal reflux disease)   Leaky heart valve   Diabetes mellitus without complication (HCC)   COPD (chronic obstructive pulmonary disease) ([place])   Fall   Past Medical History:  Diagnosis Date  . Arthritis   . COPD (chronic obstructive pulmonary disease) ([place])   . Depression   . Emphysema of lung ([place])   . GERD (gastroesophageal reflux disease)   . History of chicken pox   . History of drug abuse ([place])    about 45yrs ago - smoking crack  . Leaky heart valve    needs ABT before dental procedures  . Tobacco abuse     Surgeries: Procedure(s): INTRAMEDULLARY (IM) NAIL TIBIAL on 03/22/2020   Consultants (if any):   Discharged Condition: Improved  Hospital Course: MARNY SMETHERS is an 50 y.o. female who was admitted 03/21/2020 with a diagnosis of Closed fracture of right tibia and fibula after sustaining a fall at work, and went to the operating room on 03/22/2020 and underwent an uncomplicated intramedullary fixation of a right displaced spiral fracture of the tibia.  Patient was admitted to orthopedics postoperatively for pain control and neurovascular monitoring.  She was given perioperative antibiotics:  Anti-infectives (From admission, onward)   Start     Dose/Rate Route Frequency Ordered Stop   03/22/20 2100  ceFAZolin (ANCEF) IVPB 1 g/50 mL premix        1 g 100 mL/hr over 30 Minutes Intravenous Every 6 hours 03/22/20 2000 03/23/20 0300   03/22/20 1549  ceFAZolin (ANCEF) IVPB 2g/100 mL premix        2 g 200 mL/hr over 30 Minutes Intravenous 30 min pre-op  03/22/20 1549 03/22/20 1620    .  She was given sequential compression devices, early ambulation, and Lovenox for DVT prophylaxis.  She benefited maximally from the hospital stay and there were no complications.  Her clinical improvement she was prepared for discharge home on postop day #1.  Recent vital signs:  Vitals:   03/23/20 0753 03/23/20 1546  BP: (!) 154/73 (!) 142/70  Pulse: 82 81  Resp: 17 19  Temp: 98 F (36.7 C) 98.3 F (36.8 C)  SpO2: 95% 97%    Recent laboratory studies:  Lab Results  Component Value Date   HGB 12.7 03/23/2020   HGB 12.8 03/22/2020   HGB 14.9 03/21/2020   Lab Results  Component Value Date   WBC 12.9 (H) 03/23/2020   PLT 238 03/23/2020   Lab Results  Component Value Date   INR 1.0 03/21/2020   Lab Results  Component Value Date   NA 136 03/23/2020   K 4.2 03/23/2020   CL 102 03/23/2020   CO2 25 03/23/2020   BUN 9 03/23/2020   CREATININE 0.62 03/23/2020   GLUCOSE 168 (H) 03/23/2020    Discharge Medications:   Allergies as of 03/23/2020   No Known Allergies     Medication List    STOP taking these medications   ibuprofen 200 MG tablet Commonly known as: ADVIL   metFORMIN 500 MG 24 hr tablet Commonly known as: GLUCOPHAGE-XR   venlafaxine XR 37.5  MG 24 hr capsule Commonly known as: EFFEXOR-XR     TAKE these medications   acetaminophen 500 MG tablet Commonly known as: TYLENOL Take 1,000 mg by mouth every 6 (six) hours as needed for mild pain.   aspirin 325 MG EC tablet Take 1 tablet (325 mg total) by mouth 2 (two) times daily.   docusate sodium 100 MG capsule Commonly known as: COLACE Take 1 capsule (100 mg total) by mouth 2 (two) times daily.   nicotine 21 mg/24hr patch Commonly known as: NICODERM CQ - dosed in mg/24 hours Place 1 patch (21 mg total) onto the skin daily. Start taking on: March 24, 2020   nicotine polacrilex 2 MG gum Commonly known as: NICORETTE Take 1 each (2 mg total) by mouth as needed for  smoking cessation.   oxyCODONE 5 MG immediate release tablet Commonly known as: Oxy IR/ROXICODONE Take 1-2 tablets (5-10 mg total) by mouth every 4 (four) hours as needed for moderate pain (pain score 4-6).       Diagnostic Studies: DG Chest 1 View  Result Date: 03/21/2020 CLINICAL DATA:  Preop. History of emphysema. Smoker. Leaky heart valve. EXAM: CHEST  1 VIEW COMPARISON:  None. FINDINGS: The heart size and mediastinal contours are within normal limits. Both lungs are clear. The visualized skeletal structures are unremarkable. IMPRESSION: No active disease. Electronically Signed   By: Kerby Moors M.D.   On: 03/21/2020 15:23   DG Tibia/Fibula Right  Result Date: 03/22/2020 CLINICAL DATA:  Postop, tibial fracture EXAM: RIGHT TIBIA AND FIBULA - 2 VIEW COMPARISON:  03/21/2020 FINDINGS: Frontal and lateral views of the right tibia and fibula are obtained. Intramedullary rod with proximal and distal interlocking screws traverses the oblique distal fibular diaphyseal fracture seen previously, with anatomic alignment. Oblique proximal fibular diaphyseal fracture is unchanged. The right knee and ankle are well aligned. Diffuse soft tissue edema. Overlying casting material. IMPRESSION: 1. ORIF of a distal tibial fracture with anatomic alignment. 2. Stable proximal fibular head fracture. Electronically Signed   By: Randa Ngo M.D.   On: 03/22/2020 19:37   DG Tibia/Fibula Right  Result Date: 03/22/2020 CLINICAL DATA:  ORIF right tibia. EXAM: RIGHT TIBIA AND FIBULA - 2 VIEW; DG C-ARM 1-60 MIN COMPARISON:  Radiograph yesterday FINDINGS: Four fluoroscopic spot views of the right lower leg obtained in frontal and lateral projections. Intramedullary nail with proximal and distal locking screws traverse tibial shaft fracture. Fracture is in improved alignment. Proximal fibular fracture is again seen. Total fluoroscopy time 1 minutes 30 seconds. Dose 3.84 mGy. IMPRESSION: Intraoperative fluoroscopy during  right tibial fracture ORIF. Electronically Signed   By: Keith Rake M.D.   On: 03/22/2020 18:16   DG Tibia/Fibula Right  Result Date: 03/21/2020 CLINICAL DATA:  Fall EXAM: RIGHT TIBIA AND FIBULA - 2 VIEW COMPARISON:  Right ankle 08/29/2017 FINDINGS: Mild displaced fracture proximal right fibula. Spiral fracture with 1/2 shaft width displacement of the mid to distal tibia. Normal knee and ankle joints. IMPRESSION: Fracture of the right tibia and fibula. Electronically Signed   By: Franchot Gallo M.D.   On: 03/21/2020 14:50   DG C-Arm 1-60 Min  Result Date: 03/22/2020 CLINICAL DATA:  ORIF right tibia. EXAM: RIGHT TIBIA AND FIBULA - 2 VIEW; DG C-ARM 1-60 MIN COMPARISON:  Radiograph yesterday FINDINGS: Four fluoroscopic spot views of the right lower leg obtained in frontal and lateral projections. Intramedullary nail with proximal and distal locking screws traverse tibial shaft fracture. Fracture is in improved alignment. Proximal fibular  fracture is again seen. Total fluoroscopy time 1 minutes 30 seconds. Dose 3.84 mGy. IMPRESSION: Intraoperative fluoroscopy during right tibial fracture ORIF. Electronically Signed   By: Keith Rake M.D.   On: 03/22/2020 18:16    Disposition: Discharge disposition: 01-Home or Self Care       Discharge Instructions    Call MD / Call 911   Complete by: As directed    If you experience chest pain or shortness of breath, CALL 911 and be transported to the hospital emergency room.  If you develope a fever above 101 F, pus (white drainage) or increased drainage or redness at the wound, or calf pain, call your surgeon's office.   Constipation Prevention   Complete by: As directed    Drink plenty of fluids.  Prune juice may be helpful.  You may use a stool softener, such as Colace (over the counter) 100 mg twice a day.  Use MiraLax (over the counter) for constipation as needed.   Diet general   Complete by: As directed    Discharge instructions   Complete  by: As directed    Patient should continue strict elevation of her right lower extremity for the next week.  Continue to elevate the right ankle on pillows.  The patient may apply ice to the right ankle.  Patient may not place weight on the right ankle and is toe-touch weightbearing only on the right lower extremity with her walker.  Patient should per splint and dressing on until follow-up in the office.  Patient should cover the splint and dressing during showers with a plastic bag.  Patient did call [facility] in [place] at [phone] to set up a follow-up appointment to see Dr. Mack Guise in the office in approximately 10 to 14 days..   Driving restrictions   Complete by: As directed    No driving until follow-up with Dr. Mack Guise in the office.   Increase activity slowly as tolerated   Complete by: As directed    Lifting restrictions   Complete by: As directed    No lifting for 12-16 weeks         Signed: Thornton Park ,MD 03/23/2020, 6:05 PM

## 2020-03-23 NOTE — Plan of Care (Incomplete)
  Problem: Health Behavior/Discharge Planning: Goal: Ability to manage health-related needs will improve Outcome: Progressing   Problem: Activity: Goal: Risk for activity intolerance will decrease Outcome: Progressing   Problem: Nutrition: Goal: Adequate nutrition will be maintained Outcome: Progressing   Problem: Coping: Goal: Level of anxiety will decrease Outcome: Progressing   

## 2020-03-23 NOTE — Progress Notes (Signed)
OT Cancellation Note  Patient Details Name: Ashley Powers MRN: [id number] DOB: [birth year]   Cancelled Treatment:    Reason Eval/Treat Not Completed: Patient declined, no reason specified. Consult received, chart reviewed. Pt declines OT services. Reports she will have assist as needed at home and is only concerned about getting additional pain medication. Nursing notified. Will sign off. Please re-consult if additional needs arise.   Hanley Hays, MPH, MS, OTR/L ascom [phone] 03/23/20, 12:22 PM

## 2020-03-27 ENCOUNTER — Encounter: Payer: Self-pay | Admitting: Orthopedic Surgery

## 2021-01-29 ENCOUNTER — Other Ambulatory Visit: Payer: Self-pay

## 2021-01-29 ENCOUNTER — Other Ambulatory Visit: Payer: Self-pay | Admitting: Orthopedic Surgery

## 2021-01-29 ENCOUNTER — Other Ambulatory Visit
Admission: RE | Admit: 2021-01-29 | Discharge: 2021-01-29 | Disposition: A | Discharge status: Home / Self Care | Payer: BC Managed Care – PPO | Source: Ambulatory Visit | Attending: Orthopedic Surgery | Admitting: Orthopedic Surgery

## 2021-01-29 ENCOUNTER — Encounter
Admission: RE | Admit: 2021-01-29 | Discharge: 2021-01-29 | Disposition: A | Discharge status: Home / Self Care | Payer: Worker's Compensation | Source: Ambulatory Visit | Attending: Orthopedic Surgery | Admitting: Orthopedic Surgery

## 2021-01-29 DIAGNOSIS — Z01810 Encounter for preprocedural cardiovascular examination: Secondary | ICD-10-CM | POA: Insufficient documentation

## 2021-01-29 HISTORY — DX: Type 2 diabetes mellitus without complications: E11.9

## 2021-01-29 NOTE — Patient Instructions (Addendum)
Your procedure is scheduled on: Tuesday February 06, 2021. Report to Day Surgery inside [facility] 2nd floor. To find out your arrival time please call [phone] between 1PM - 3PM on Monday February 05, 2021.  Remember: Instructions that are not followed completely may result in serious medical risk,  up to and including death, or upon the discretion of your surgeon and anesthesiologist your  surgery may need to be rescheduled.     _X__ 1. Do not eat food or drink fluids after midnight the night before your procedure.                 No chewing gum or hard candies.  __X__2.  On the morning of surgery brush your teeth with toothpaste and water, you                may rinse your mouth with mouthwash if you wish.  Do not swallow any toothpaste or mouthwash.     _X__ 3.  No Alcohol for 24 hours before or after surgery.   _X__ 4.  Do Not Smoke or use e-cigarettes For 24 Hours Prior to Your Surgery.                 Do not use any chewable tobacco products for at least 6 hours prior to                 Surgery.  _X__  5.  Do not use any recreational drugs (marijuana, cocaine, heroin, ecstasy, MDMA or other)                For at least one week prior to your surgery.  Combination of these drugs with anesthesia                May have life threatening results.  ____  6.  Bring all medications with you on the day of surgery if instructed.   __X__  7.  Notify your doctor if there is any change in your medical condition      (cold, fever, infections).     Do not wear jewelry, make-up, hairpins, clips or nail polish. Do not wear lotions, powders, or perfumes. You may wear deodorant. Do not shave 48 hours prior to surgery.  Do not bring valuables to the hospital.    [facility] is not responsible for any belongings or valuables.  Contacts, dentures or bridgework may not be worn into surgery. Leave your suitcase in the car. After surgery it may be brought to your  room. For patients admitted to the hospital, discharge time is determined by your treatment team.   Patients discharged the day of surgery will not be allowed to drive home.   Make arrangements for someone to be with you for the first 24 hours of your Same Day Discharge.   __X__ Take these medicines the morning of surgery with A SIP OF WATER:    1. None   2.   3.   4.  5.  6.  ____ Fleet Enema (as directed)   __X__ Use CHG Soap (or wipes) as directed  ____ Use Benzoyl Peroxide Gel as instructed  ____ Use inhalers on the day of surgery  __X__ Stop  metFORMIN (GLUCOPHAGE-XR) 2 days prior to surgery (Take last dose Saturday 02/03/21)   ____ Take 1/2 of usual insulin dose the night before surgery. No insulin the morning          of surgery.   ____  Call your PCP, cardiologist, or Pulmonologist if taking Coumadin/Plavix/aspirin and ask when to stop before your surgery.   __X__ One Week prior to surgery- Stop Anti-inflammatories such as Ibuprofen, Aleve, Advil, Motrin, meloxicam (MOBIC), diclofenac, etodolac, ketorolac, Toradol, Daypro, piroxicam, Goody's or BC powders. OK TO USE TYLENOL IF NEEDED   __X__ Stop supplements until after surgery.    ____ Bring C-Pap to the hospital.    If you have any questions regarding your pre-procedure instructions,  Please call Pre-admit Testing at [phone]

## 2021-02-06 ENCOUNTER — Other Ambulatory Visit: Payer: Self-pay

## 2021-02-06 ENCOUNTER — Ambulatory Visit: Payer: Worker's Compensation | Admitting: Anesthesiology

## 2021-02-06 ENCOUNTER — Encounter
Admission: RE | Disposition: A | Discharge status: Home / Self Care | Payer: Self-pay | Source: Home / Self Care | Attending: Orthopedic Surgery

## 2021-02-06 ENCOUNTER — Ambulatory Visit: Payer: Worker's Compensation

## 2021-02-06 ENCOUNTER — Encounter: Payer: Self-pay | Admitting: Orthopedic Surgery

## 2021-02-06 ENCOUNTER — Ambulatory Visit
Admission: RE | Admit: 2021-02-06 | Discharge: 2021-02-06 | Disposition: A | Discharge status: Home / Self Care | Payer: Worker's Compensation | Attending: Orthopedic Surgery | Admitting: Orthopedic Surgery

## 2021-02-06 DIAGNOSIS — T8484XA Pain due to internal orthopedic prosthetic devices, implants and grafts, initial encounter: Secondary | ICD-10-CM | POA: Insufficient documentation

## 2021-02-06 DIAGNOSIS — Z7984 Long term (current) use of oral hypoglycemic drugs: Secondary | ICD-10-CM | POA: Insufficient documentation

## 2021-02-06 DIAGNOSIS — J439 Emphysema, unspecified: Secondary | ICD-10-CM | POA: Insufficient documentation

## 2021-02-06 DIAGNOSIS — E119 Type 2 diabetes mellitus without complications: Secondary | ICD-10-CM | POA: Diagnosis not present

## 2021-02-06 DIAGNOSIS — F17200 Nicotine dependence, unspecified, uncomplicated: Secondary | ICD-10-CM | POA: Diagnosis not present

## 2021-02-06 HISTORY — PX: HARDWARE REMOVAL: SHX979

## 2021-02-06 LAB — GLUCOSE, CAPILLARY
Glucose-Capillary: 175 mg/dL — ABNORMAL HIGH (ref 70–99)
Glucose-Capillary: 164 mg/dL — ABNORMAL HIGH (ref 70–99)

## 2021-02-06 SURGERY — REMOVAL, HARDWARE
Anesthesia: General | Laterality: Right

## 2021-02-06 MED ORDER — FAMOTIDINE 20 MG PO TABS
ORAL_TABLET | ORAL | Status: AC
  Administered 2021-02-06: 20 mg via ORAL
  Filled 2021-02-06: qty 1

## 2021-02-06 MED ORDER — ONDANSETRON HCL 4 MG/2ML IJ SOLN
INTRAMUSCULAR | Status: DC | PRN
  Administered 2021-02-06: 4 mg via INTRAVENOUS

## 2021-02-06 MED ORDER — 0.9 % SODIUM CHLORIDE (POUR BTL) OPTIME
TOPICAL | Status: DC | PRN
Start: 2021-02-06 — End: 2021-02-06
  Administered 2021-02-06: 500 mL

## 2021-02-06 MED ORDER — PROPOFOL 10 MG/ML IV BOLUS
INTRAVENOUS | Status: DC | PRN
  Administered 2021-02-06: 200 mg via INTRAVENOUS

## 2021-02-06 MED ORDER — CHLORHEXIDINE GLUCONATE 0.12 % MT SOLN
OROMUCOSAL | Status: AC
  Administered 2021-02-06: 15 mL via OROMUCOSAL
  Filled 2021-02-06: qty 15

## 2021-02-06 MED ORDER — DEXAMETHASONE SODIUM PHOSPHATE 10 MG/ML IJ SOLN
INTRAMUSCULAR | Status: AC
  Filled 2021-02-06: qty 1

## 2021-02-06 MED ORDER — KETOROLAC TROMETHAMINE 30 MG/ML IJ SOLN
INTRAMUSCULAR | Status: DC | PRN
  Administered 2021-02-06: 30 mg via INTRAVENOUS

## 2021-02-06 MED ORDER — CHLORHEXIDINE GLUCONATE CLOTH 2 % EX PADS
6.0000 | MEDICATED_PAD | Freq: Once | CUTANEOUS | Status: AC
  Administered 2021-02-06: 6 via TOPICAL

## 2021-02-06 MED ORDER — CEFAZOLIN SODIUM-DEXTROSE 2-4 GM/100ML-% IV SOLN
INTRAVENOUS | Status: AC
  Filled 2021-02-06: qty 100

## 2021-02-06 MED ORDER — PROPOFOL 10 MG/ML IV BOLUS
INTRAVENOUS | Status: AC
  Filled 2021-02-06: qty 20

## 2021-02-06 MED ORDER — ONDANSETRON HCL 4 MG/2ML IJ SOLN
INTRAMUSCULAR | Status: AC
  Filled 2021-02-06: qty 2

## 2021-02-06 MED ORDER — MIDAZOLAM HCL 2 MG/2ML IJ SOLN
INTRAMUSCULAR | Status: DC | PRN
  Administered 2021-02-06 (×2): 1 mg via INTRAVENOUS

## 2021-02-06 MED ORDER — FENTANYL CITRATE (PF) 100 MCG/2ML IJ SOLN
INTRAMUSCULAR | Status: AC
  Filled 2021-02-06: qty 2

## 2021-02-06 MED ORDER — MIDAZOLAM HCL 2 MG/2ML IJ SOLN
INTRAMUSCULAR | Status: AC
  Filled 2021-02-06: qty 2

## 2021-02-06 MED ORDER — BUPIVACAINE HCL (PF) 0.5 % IJ SOLN
INTRAMUSCULAR | Status: AC
  Filled 2021-02-06: qty 30

## 2021-02-06 MED ORDER — DEXAMETHASONE SODIUM PHOSPHATE 10 MG/ML IJ SOLN
INTRAMUSCULAR | Status: DC | PRN
  Administered 2021-02-06: 10 mg via INTRAVENOUS

## 2021-02-06 MED ORDER — ORAL CARE MOUTH RINSE: 15.0000 mL | Freq: Once | OROMUCOSAL | Status: AC

## 2021-02-06 MED ORDER — SODIUM CHLORIDE 0.9 % IV SOLN: INTRAVENOUS | Status: DC

## 2021-02-06 MED ORDER — LIDOCAINE HCL (CARDIAC) PF 100 MG/5ML IV SOSY
PREFILLED_SYRINGE | INTRAVENOUS | Status: DC | PRN
Start: 2021-02-06 — End: 2021-02-06
  Administered 2021-02-06: 80 mg via INTRAVENOUS

## 2021-02-06 MED ORDER — BUPIVACAINE HCL 0.5 % IJ SOLN
INTRAMUSCULAR | Status: DC | PRN
  Administered 2021-02-06: 5 mL

## 2021-02-06 MED ORDER — CEFAZOLIN SODIUM-DEXTROSE 2-4 GM/100ML-% IV SOLN
2.0000 g | INTRAVENOUS | Status: AC
  Administered 2021-02-06: 2 g via INTRAVENOUS

## 2021-02-06 MED ORDER — LIDOCAINE HCL (PF) 2 % IJ SOLN
INTRAMUSCULAR | Status: AC
  Filled 2021-02-06: qty 5

## 2021-02-06 MED ORDER — ONDANSETRON HCL 4 MG/2ML IJ SOLN: 4.0000 mg | Freq: Once | INTRAMUSCULAR | Status: DC | PRN

## 2021-02-06 MED ORDER — HYDROCODONE-ACETAMINOPHEN 5-325 MG PO TABS: 1.0000 | ORAL_TABLET | Freq: Four times a day (QID) | ORAL | 0 refills | Status: AC | PRN

## 2021-02-06 MED ORDER — PHENYLEPHRINE 40 MCG/ML (10ML) SYRINGE FOR IV PUSH (FOR BLOOD PRESSURE SUPPORT)
PREFILLED_SYRINGE | INTRAVENOUS | Status: DC | PRN
  Administered 2021-02-06: 80 ug via INTRAVENOUS

## 2021-02-06 MED ORDER — CHLORHEXIDINE GLUCONATE 0.12 % MT SOLN: 15.0000 mL | Freq: Once | OROMUCOSAL | Status: AC

## 2021-02-06 MED ORDER — PHENYLEPHRINE HCL-NACL 20-0.9 MG/250ML-% IV SOLN
INTRAVENOUS | Status: AC
  Filled 2021-02-06: qty 250

## 2021-02-06 MED ORDER — ACETAMINOPHEN 500 MG PO TABS: 1000.0000 mg | ORAL_TABLET | ORAL | Status: AC

## 2021-02-06 MED ORDER — ACETAMINOPHEN 500 MG PO TABS
ORAL_TABLET | ORAL | Status: AC
  Administered 2021-02-06: 1000 mg via ORAL
  Filled 2021-02-06: qty 2

## 2021-02-06 MED ORDER — ONDANSETRON HCL 4 MG PO TABS: 4.0000 mg | ORAL_TABLET | Freq: Three times a day (TID) | ORAL | 0 refills | Status: AC | PRN

## 2021-02-06 MED ORDER — FENTANYL CITRATE (PF) 100 MCG/2ML IJ SOLN: 25.0000 ug | INTRAMUSCULAR | Status: DC | PRN

## 2021-02-06 MED ORDER — FENTANYL CITRATE (PF) 100 MCG/2ML IJ SOLN
INTRAMUSCULAR | Status: DC | PRN
  Administered 2021-02-06: 50 ug via INTRAVENOUS

## 2021-02-06 MED ORDER — FAMOTIDINE 20 MG PO TABS
20.0000 mg | ORAL_TABLET | Freq: Once | ORAL | Status: AC
Start: 2021-02-06 — End: 2021-02-06

## 2021-02-06 SURGICAL SUPPLY — 33 items
BLADE SURG 15 STRL LF DISP TIS (BLADE) ×1 IMPLANT
BLADE SURG 15 STRL SS (BLADE) ×2
BNDG COHESIVE 4X5 TAN ST LF (GAUZE/BANDAGES/DRESSINGS) IMPLANT
BNDG STRETCH 4X75 STRL LF (GAUZE/BANDAGES/DRESSINGS) ×1 IMPLANT
DRAPE FLUOR MINI C-ARM 54X84 (DRAPES) ×2 IMPLANT
DRAPE U-SHAPE 47X51 STRL (DRAPES) ×2 IMPLANT
DURAPREP 26ML APPLICATOR (WOUND CARE) ×3 IMPLANT
ELECT CAUTERY BLADE 6.4 (BLADE) ×1 IMPLANT
ELECT REM PT RETURN 9FT ADLT (ELECTROSURGICAL) ×2
ELECTRODE REM PT RTRN 9FT ADLT (ELECTROSURGICAL) ×1 IMPLANT
GAUZE SPONGE 4X4 12PLY STRL (GAUZE/BANDAGES/DRESSINGS) ×2 IMPLANT
GAUZE XEROFORM 1X8 LF (GAUZE/BANDAGES/DRESSINGS) ×3 IMPLANT
GLOVE SURG ORTHO LTX SZ9 (GLOVE) ×2 IMPLANT
GLOVE SURG UNDER POLY LF SZ9 (GLOVE) ×2 IMPLANT
GOWN STRL REUS TWL 2XL XL LVL4 (GOWN DISPOSABLE) ×2 IMPLANT
GOWN STRL REUS W/ TWL LRG LVL3 (GOWN DISPOSABLE) ×1 IMPLANT
GOWN STRL REUS W/TWL LRG LVL3 (GOWN DISPOSABLE) ×2
KIT TURNOVER KIT A (KITS) ×2 IMPLANT
MANIFOLD NEPTUNE II (INSTRUMENTS) ×2 IMPLANT
PACK EXTREMITY ARMC (MISCELLANEOUS) ×2 IMPLANT
PAD CAST CTTN 4X4 STRL (SOFTGOODS) ×1 IMPLANT
PADDING CAST COTTON 4X4 STRL (SOFTGOODS) ×2
SPONGE T-LAP 18X18 ~~LOC~~+RFID (SPONGE) ×2 IMPLANT
STOCKINETTE IMPERVIOUS 9X36 MD (GAUZE/BANDAGES/DRESSINGS) IMPLANT
STOCKINETTE STRL 6IN 960660 (GAUZE/BANDAGES/DRESSINGS) ×2 IMPLANT
STRIP CLOSURE SKIN 1/2X4 (GAUZE/BANDAGES/DRESSINGS) ×2 IMPLANT
SUT ETHILON 4-0 (SUTURE) ×2
SUT ETHILON 4-0 FS2 18XMFL BLK (SUTURE) ×1
SUT VIC AB 2-0 SH 27 (SUTURE) ×2
SUT VIC AB 2-0 SH 27XBRD (SUTURE) ×1 IMPLANT
SUTURE ETHLN 4-0 FS2 18XMF BLK (SUTURE) IMPLANT
SYR 30ML LL (SYRINGE) ×2 IMPLANT
TAPE PAPER 2X10 WHT MICROPORE (GAUZE/BANDAGES/DRESSINGS) ×2 IMPLANT

## 2021-02-06 NOTE — Transfer of Care (Signed)
Immediate Anesthesia Transfer of Care Note  Patient: Ashley Powers  Procedure(s) Performed: HARDWARE REMOVAL (Right)  Patient Location: PACU  Anesthesia Type:General  Level of Consciousness: drowsy  Airway & Oxygen Therapy: Patient Spontanous Breathing and Patient connected to face mask oxygen  Post-op Assessment: Report given to RN and Post -op Vital signs reviewed and stable  Post vital signs: Reviewed and stable  Last Vitals:  Vitals Value Taken Time  BP 115/68 02/06/21 0849  Temp 36.6 C 02/06/21 0849  Pulse 85 02/06/21 0855  Resp 21 02/06/21 0855  SpO2 100 % 02/06/21 0855    Last Pain:  Vitals:   02/06/21 0628  PainSc: 2          Complications: No notable events documented.

## 2021-02-06 NOTE — Anesthesia Postprocedure Evaluation (Signed)
Anesthesia Post Note  Patient: Ashley Powers  Procedure(s) Performed: HARDWARE REMOVAL (Right)  Patient location during evaluation: PACU Anesthesia Type: General Level of consciousness: awake and alert Pain management: pain level controlled Vital Signs Assessment: post-procedure vital signs reviewed and stable Respiratory status: spontaneous breathing, nonlabored ventilation, respiratory function stable and patient connected to nasal cannula oxygen Cardiovascular status: blood pressure returned to baseline and stable Postop Assessment: no apparent nausea or vomiting Anesthetic complications: no   No notable events documented.   Last Vitals:  Vitals:   02/06/21 0924 02/06/21 0926  BP:  (!) 156/83  Pulse: 67   Resp: 16   Temp: (!) 36.1 C   SpO2: 99%     Last Pain:  Vitals:   02/06/21 0924  TempSrc: Temporal  PainSc: 0-No pain                 Molli Barrows

## 2021-02-06 NOTE — H&P (Signed)
PREOPERATIVE H&P  Chief Complaint: Painful hardware, right ankle  HPI: Ashley Powers is a 51 y.o. female who presents for preoperative history and physical with a diagnosis of painful hardware right ankle.  Patient is status post intramedullary fixation for a right tibial shaft fracture on 03/22/2020.  Her tibia is now well-healed, but she is having tenderness over the distal medial screw sites.  Patient is elected for surgical removal of the distal screws.  Past Medical History:  Diagnosis Date   Arthritis    COPD (chronic obstructive pulmonary disease) ([place])    Depression    Diabetes mellitus without complication (HCC)    Emphysema of lung ([place])    GERD (gastroesophageal reflux disease)    History of chicken pox    History of drug abuse ([place])    about 71yrs ago - smoking crack   Leaky heart valve    needs ABT before dental procedures   Tobacco abuse    Past Surgical History:  Procedure Laterality Date   TIBIA IM NAIL INSERTION Right 03/22/2020   Procedure: INTRAMEDULLARY (IM) NAIL TIBIAL;  Surgeon: Thornton Park, MD;  Location: [facility] ORS;  Service: Orthopedics;  Laterality: Right;   TUBAL LIGATION  2001   Social History   Socioeconomic History   Marital status: Widowed    Spouse name: Not on file   Number of children: 2   Years of education: Not on file   Highest education level: Not on file  Occupational History   Occupation: Solicitor    Comment: Banker  Tobacco Use   Smoking status: Every Day    Packs/day: 1.00    Years: 24.00    Pack years: 24.00    Types: Cigarettes   Smokeless tobacco: Never  Vaping Use   Vaping Use: Some days   Substances: Nicotine  Substance and Sexual Activity   Alcohol use: Not Currently    Alcohol/week: 2.0 standard drinks    Types: 2 Glasses of wine per week   Drug use: Yes    Types: Marijuana    Comment: marijuana occasionally    Sexual activity: Not on file  Other Topics Concern   Not on file  Social History  Narrative   Ashley Powers grew up in [place], [place]. She is married and has two children Ashley Powers, Ashley Powers). 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 Hobbies: reading.      Caffeine - 1 cup of coffee   Exercise - none at present   Social Determinants of Health   Financial Resource Strain: Not on file  Food Insecurity: Not on file  Transportation Needs: Not on file  Physical Activity: Not on file  Stress: Not on file  Social Connections: Not on file   Family History  Problem Relation Age of Onset   Arthritis Mother    Cancer Mother        melanoma   Heart disease Father        4 heart attacks. Died age 67   Diabetes Father    Diabetes Paternal Aunt    Alcohol abuse Maternal Grandfather    Arthritis Paternal Grandmother    Alcohol abuse Paternal Grandfather    No Known Allergies Prior to Admission medications   Medication Sig Start Date End Date Taking? Authorizing Provider  acetaminophen (TYLENOL) 500 MG tablet Take 1,000 mg by mouth every 6 (six) hours as needed for mild pain.   Yes [provider]  glipiZIDE (GLUCOTROL XL) 5 MG 24 hr  tablet Take 5 mg by mouth daily with breakfast.   Yes [provider]  ipratropium (ATROVENT HFA) 17 MCG/ACT inhaler Inhale 2 puffs into the lungs every 6 (six) hours as needed for wheezing.   Yes [provider]  losartan (COZAAR) 25 MG tablet Take 25 mg by mouth daily.   Yes [provider]  metFORMIN (GLUCOPHAGE-XR) 500 MG 24 hr tablet Take 1,000 mg by mouth every evening. 08/22/20  Yes [provider]  Multiple Vitamins-Minerals (MULTIVITAMIN WITH MINERALS) tablet Take 1 tablet by mouth daily.   Yes [provider]  venlafaxine XR (EFFEXOR-XR) 37.5 MG 24 hr capsule Take 37.5 mg by mouth daily with breakfast.   Yes [provider]     Positive ROS: All other systems have been reviewed and were otherwise negative with the exception of those mentioned in the HPI and as  above.  Physical Exam: General: Alert, no acute distress Cardiovascular: Regular rate and rhythm, no murmurs rubs or gallops.  No pedal edema Respiratory: Clear to auscultation bilaterally, no wheezes rales or rhonchi. No cyanosis, no use of accessory musculature GI: No organomegaly, abdomen is soft and non-tender nondistended with positive bowel sounds. Skin: Skin intact, no lesions within the operative field. Neurologic: Sensation intact distally Psychiatric: Patient is competent for consent with normal mood and affect Lymphatic: No cervical lymphadenopathy  MUSCULOSKELETAL: Right ankle: Patient skin is intact.  She has tenderness over the distal medial screws.  She is neurovascular intact and has intact motor function distally.  She is nontender over the fracture site.  There is no erythema ecchymosis or swelling.  Assessment: Painful hardware, right Ankle   Plan: Plan for Procedure(s): HARDWARE REMOVAL right ankle  I reviewed the details of the procedure with the patient.  I marked the right ankle according to hospital's correct site of surgery protocol.  Preop history and physical was performed.  Answered all her questions.  I discussed the risks and benefits of surgery. The risks include but are not limited to infection, bleeding, nerve or blood vessel injury, joint stiffness or loss of motion, persistent pain, weakness or instability, fracture, screw breakage and the need for further surgery.  Patient understood these risks and wished to proceed.     Thornton Park, MD   02/06/2021 7:46 AM

## 2021-02-06 NOTE — Discharge Instructions (Signed)
AMBULATORY SURGERY  ?DISCHARGE INSTRUCTIONS ? ? ?The drugs that you were given will stay in your system until tomorrow so for the next 24 hours you should not: ? ?Drive an automobile ?Make any legal decisions ?Drink any alcoholic beverage ? ? ?You may resume regular meals tomorrow.  Today it is better to start with liquids and gradually work up to solid foods. ? ?You may eat anything you prefer, but it is better to start with liquids, then soup and crackers, and gradually work up to solid foods. ? ? ?Please notify your doctor immediately if you have any unusual bleeding, trouble breathing, redness and pain at the surgery site, drainage, fever, or pain not relieved by medication. ? ? ? ?Additional Instructions: ? ? ? ?Please contact your physician with any problems or Same Day Surgery at [phone], Monday through Friday 6 am to 4 pm, or [facility] Main number at [phone].  ?

## 2021-02-06 NOTE — Anesthesia Preprocedure Evaluation (Signed)
Anesthesia Evaluation  Patient identified by MRN, date of birth, ID band Patient awake    Reviewed: Allergy & Precautions, H&P , NPO status , Patient's Chart, lab work & pertinent test results, reviewed documented beta blocker date and time   Airway Mallampati: II  TM Distance: >3 FB Neck ROM: full    Dental  (+) Teeth Intact   Pulmonary COPD,  COPD inhaler, Current Smoker and Patient abstained from smoking.,    Pulmonary exam normal        Cardiovascular Exercise Tolerance: Good negative cardio ROS Normal cardiovascular exam Rate:Normal     Neuro/Psych PSYCHIATRIC DISORDERS Depression negative neurological ROS     GI/Hepatic Neg liver ROS, GERD  Medicated,  Endo/Other  negative endocrine ROSdiabetes, Well Controlled, Type 2, Oral Hypoglycemic Agents  Renal/GU negative Renal ROS  negative genitourinary   Musculoskeletal   Abdominal   Peds  Hematology negative hematology ROS (+)   Anesthesia Other Findings   Reproductive/Obstetrics negative OB ROS                             Anesthesia Physical Anesthesia Plan  ASA: 3  Anesthesia Plan: General LMA   Post-op Pain Management:    Induction:   PONV Risk Score and Plan: 3  Airway Management Planned:   Additional Equipment:   Intra-op Plan:   Post-operative Plan:   Informed Consent: I have reviewed the patients History and Physical, chart, labs and discussed the procedure including the risks, benefits and alternatives for the proposed anesthesia with the patient or authorized representative who has indicated his/her understanding and acceptance.       Plan Discussed with: CRNA  Anesthesia Plan Comments:         Anesthesia Quick Evaluation

## 2021-02-06 NOTE — Op Note (Signed)
02/06/2021  8:56 AM  PATIENT:  Ashley Powers    PRE-OPERATIVE DIAGNOSIS:  Right Ankle painful hardware  POST-OPERATIVE DIAGNOSIS:  Same  PROCEDURE:  RIGHT ANKLE HARDWARE REMOVAL  SURGEON:  Thornton Park, MD  ANESTHESIA:   General  PREOPERATIVE INDICATIONS:  Ashley Powers is a  51 y.o. female with a diagnosis of Right Ankle Fracture who failed conservative measures and elected for surgical management.    I discussed the risks and benefits of surgery. The risks include but are not limited to infection, bleeding requiring blood transfusion, nerve or blood vessel injury, joint stiffness or loss of motion, persistent pain, weakness or instability, malunion, nonunion and hardware failure and the need for further surgery.  Patient understood these risks and wished to proceed.   OPERATIVE PROCEDURE: Patient was met in the preoperative area.  The right ankle was marked according to the hospital's correct site of surgery protocol.  She was placed supine on the operative table.  All bony prominences were adequately padded.  Patient underwent general anesthesia.  She was prepped and draped in a sterile fashion and a tourniquet was applied to the right upper thigh.  A timeout was performed to verify the patient's name, date of birth, medical record number, correct site of surgery and correct procedure to be performed.  She received 2 g of Ancef IV prior to the incision being made.  The distal interlocking screw heads were palpable medially through the skin.  Incision was made over each screw of approximately 1 cm.  All bleeding vessels were cauterized.  A hemostat was used to spread overlying soft tissue from over each screw head.  The screws were removed manually with a screwdriver.  A FluoroScan was used to take a confirmatory image confirming screw removal and no breakage of the screw.  Wounds were irrigated.  4-0 nylon was used to close each incision.  Steri-Strips were applied.  Patient was injected  with 0.5 % Marcaine plain at the surgical site.  A dry sterile dressing was applied.  Patient was woken brought to the PACU in stable condition.  I was scrubbed and present for the entire case.  All sharp sponge and instrument counts were correct at the conclusion the case.

## 2021-02-06 NOTE — Anesthesia Procedure Notes (Signed)
Procedure Name: LMA Insertion Date/Time: 02/06/2021 7:58 AM Performed by: Loletha Grayer, CRNA Pre-anesthesia Checklist: Patient identified, Patient being monitored, Timeout performed, Emergency Drugs available and Suction available Patient Re-evaluated:Patient Re-evaluated prior to induction Oxygen Delivery Method: Circle system utilized Preoxygenation: Pre-oxygenation with 100% oxygen Induction Type: IV induction Ventilation: Mask ventilation without difficulty LMA: LMA inserted LMA Size: 3.0 Number of attempts: 1 Placement Confirmation: positive ETCO2 and breath sounds checked- equal and bilateral Tube secured with: Tape Dental Injury: Teeth and Oropharynx as per pre-operative assessment

## 2021-03-23 ENCOUNTER — Encounter: Payer: Self-pay | Admitting: *Deleted

## 2021-03-26 ENCOUNTER — Encounter: Payer: Self-pay | Admitting: *Deleted

## 2021-03-26 ENCOUNTER — Ambulatory Visit
Admission: RE | Admit: 2021-03-26 | Discharge: 2021-03-26 | Disposition: A | Discharge status: Home / Self Care | Payer: BC Managed Care – PPO | Attending: Gastroenterology | Admitting: Gastroenterology

## 2021-03-26 ENCOUNTER — Encounter
Admission: RE | Disposition: A | Discharge status: Home / Self Care | Payer: Self-pay | Source: Home / Self Care | Attending: Gastroenterology

## 2021-03-26 ENCOUNTER — Ambulatory Visit: Payer: BC Managed Care – PPO | Admitting: Certified Registered Nurse Anesthetist

## 2021-03-26 DIAGNOSIS — F32A Depression, unspecified: Secondary | ICD-10-CM | POA: Insufficient documentation

## 2021-03-26 DIAGNOSIS — D124 Benign neoplasm of descending colon: Secondary | ICD-10-CM | POA: Diagnosis not present

## 2021-03-26 DIAGNOSIS — Z1211 Encounter for screening for malignant neoplasm of colon: Secondary | ICD-10-CM | POA: Diagnosis present

## 2021-03-26 DIAGNOSIS — Z800 Family history of malignant neoplasm of digestive organs: Secondary | ICD-10-CM | POA: Insufficient documentation

## 2021-03-26 DIAGNOSIS — F17200 Nicotine dependence, unspecified, uncomplicated: Secondary | ICD-10-CM | POA: Diagnosis not present

## 2021-03-26 DIAGNOSIS — J439 Emphysema, unspecified: Secondary | ICD-10-CM | POA: Insufficient documentation

## 2021-03-26 DIAGNOSIS — E119 Type 2 diabetes mellitus without complications: Secondary | ICD-10-CM | POA: Insufficient documentation

## 2021-03-26 HISTORY — PX: COLONOSCOPY WITH PROPOFOL: SHX5780

## 2021-03-26 LAB — GLUCOSE, CAPILLARY: Glucose-Capillary: 106 mg/dL — ABNORMAL HIGH (ref 70–99)

## 2021-03-26 SURGERY — COLONOSCOPY WITH PROPOFOL
Anesthesia: General

## 2021-03-26 MED ORDER — SODIUM CHLORIDE 0.9 % IV SOLN
INTRAVENOUS | Status: DC
  Administered 2021-03-26: 20 mL/h via INTRAVENOUS

## 2021-03-26 MED ORDER — PROPOFOL 500 MG/50ML IV EMUL
INTRAVENOUS | Status: DC | PRN
  Administered 2021-03-26: 170 ug/kg/min via INTRAVENOUS

## 2021-03-26 MED ORDER — PROPOFOL 10 MG/ML IV BOLUS
INTRAVENOUS | Status: DC | PRN
Start: 2021-03-26 — End: 2021-03-26
  Administered 2021-03-26: 60 mg via INTRAVENOUS
  Administered 2021-03-26: 10 mg via INTRAVENOUS

## 2021-03-26 NOTE — Transfer of Care (Signed)
Immediate Anesthesia Transfer of Care Note ? ?Patient: Ashley Powers ? ?Procedure(s) Performed: COLONOSCOPY WITH PROPOFOL ? ?Patient Location: PACU ? ?Anesthesia Type:General ? ?Level of Consciousness: awake and alert  ? ?Airway & Oxygen Therapy: Patient Spontanous Breathing and Patient connected to nasal cannula oxygen ? ?Post-op Assessment: Report given to RN and Post -op Vital signs reviewed and stable ? ?Post vital signs: Reviewed and stable ? ?Last Vitals:  ?Vitals Value Taken Time  ?BP    ?Temp    ?Pulse    ?Resp    ?SpO2    ? ? ?Last Pain:  ?Vitals:  ? 03/26/21 0856  ?TempSrc: Temporal  ?PainSc: 0-No pain  ?   ? ?  ? ?Complications: No notable events documented. ?

## 2021-03-26 NOTE — H&P (Signed)
Outpatient short stay form Pre-procedure ?03/26/2021  ?Lesly Rubenstein, MD ? ?Primary Physician: [facility] ? ?Reason for visit:  Screening colonoscopy ? ?History of present illness:   ? ?51 y/o lady with history of DM II here for screening colonoscopy. Brother was diagnosed with colon cancer at the age of 66. History of tubal ligation. No blood thinners. ? ? ? ?Current Facility-Administered Medications:  ?  0.9 %  sodium chloride infusion, , Intravenous, Continuous, [place] Lebeau, Hilton Cork, MD, Last Rate: 20 mL/hr at 03/26/21 0909, Continued from Pre-op at 03/26/21 0909 ? ?Medications Prior to Admission  ?Medication Sig Dispense Refill Last Dose  ? acetaminophen (TYLENOL) 500 MG tablet Take 1,000 mg by mouth every 6 (six) hours as needed for mild pain.   Past Week  ? glipiZIDE (GLUCOTROL XL) 5 MG 24 hr tablet Take 5 mg by mouth daily with breakfast.   Past Week  ? HYDROcodone-acetaminophen (NORCO) 5-325 MG tablet Take 1 tablet by mouth every 6 (six) hours as needed for moderate pain. 20 tablet 0 Past Week  ? ipratropium (ATROVENT HFA) 17 MCG/ACT inhaler Inhale 2 puffs into the lungs every 6 (six) hours as needed for wheezing.   Past Week  ? losartan (COZAAR) 25 MG tablet Take 25 mg by mouth daily.   Past Week  ? metFORMIN (GLUCOPHAGE-XR) 500 MG 24 hr tablet Take 1,000 mg by mouth every evening.   Past Week  ? Multiple Vitamins-Minerals (MULTIVITAMIN WITH MINERALS) tablet Take 1 tablet by mouth daily.   Past Week  ? ondansetron (ZOFRAN) 4 MG tablet Take 1 tablet (4 mg total) by mouth every 8 (eight) hours as needed for nausea or vomiting. 30 tablet 0 Past Week  ? venlafaxine XR (EFFEXOR-XR) 37.5 MG 24 hr capsule Take 37.5 mg by mouth daily with breakfast.   Past Week  ? ? ? ?No Known Allergies ? ? ?Past Medical History:  ?Diagnosis Date  ? Arthritis   ? COPD (chronic obstructive pulmonary disease) ([place])   ? Depression   ? Diabetes mellitus without complication ([place])   ? Emphysema of lung ([place])   ? GERD  (gastroesophageal reflux disease)   ? History of chicken pox   ? History of drug abuse ([place])   ? about 23yr ago - smoking crack  ? Leaky heart valve   ? needs ABT before dental procedures  ? Tobacco abuse   ? ? ?Review of systems:  Otherwise negative.  ? ? ?Physical Exam ? ?Gen: Alert, oriented. Appears stated age.  ?HEENT: PERRLA. ?Lungs: No respiratory distress ?CV: RRR ?Abd: soft, benign, no masses ?Ext: No edema ? ? ? ?Planned procedures: Proceed with colonoscopy. The patient understands the nature of the planned procedure, indications, risks, alternatives and potential complications including but not limited to bleeding, infection, perforation, damage to internal organs and possible oversedation/side effects from anesthesia. The patient agrees and gives consent to proceed.  ?Please refer to procedure notes for findings, recommendations and patient disposition/instructions.  ? ? ? ?CLesly Rubenstein MD ?[facility] ? ? ? ?  ? ?

## 2021-03-26 NOTE — Anesthesia Preprocedure Evaluation (Signed)
Anesthesia Evaluation  ?Patient identified by MRN, date of birth, ID band ?Patient awake ? ? ? ?Reviewed: ?Allergy & Precautions, NPO status , Patient's Chart, lab work & pertinent test results ? ?History of Anesthesia Complications ?Negative for: history of anesthetic complications ? ?Airway ?Mallampati: III ? ?TM Distance: <3 FB ?Neck ROM: full ? ? ? Dental ? ?(+) Chipped, Poor Dentition, Missing ?  ?Pulmonary ?COPD, Current Smoker and Patient abstained from smoking.,  ?  ?Pulmonary exam normal ? ? ? ? ? ? ? Cardiovascular ?(-) angina(-) Past MI and (-) DOE negative cardio ROS ?Normal cardiovascular exam ? ? ?  ?Neuro/Psych ?PSYCHIATRIC DISORDERS negative neurological ROS ?   ? GI/Hepatic ?Neg liver ROS, GERD  Controlled,  ?Endo/Other  ?diabetes, Type 2 ? Renal/GU ?negative Renal ROS  ?negative genitourinary ?  ?Musculoskeletal ? ?(+) Arthritis ,  ? Abdominal ?  ?Peds ? Hematology ?negative hematology ROS ?(+)   ?Anesthesia Other Findings ?Past Medical History: ?No date: Arthritis ?No date: COPD (chronic obstructive pulmonary disease) ([place]) ?No date: Depression ?No date: Diabetes mellitus without complication ([place]) ?No date: Emphysema of lung ([place]) ?No date: GERD (gastroesophageal reflux disease) ?No date: History of chicken pox ?No date: History of drug abuse ([place]) ?    Comment:  about 66yr ago - smoking crack ?No date: Leaky heart valve ?    Comment:  needs ABT before dental procedures ?No date: Tobacco abuse ? ?Past Surgical History: ?02/06/2021: HARDWARE REMOVAL; Right ?    Comment:  Procedure: HARDWARE REMOVAL;  Surgeon: KThornton Park ?             MD;  Location: [facility] ORS;  Service: Orthopedics;   ?             Laterality: Right; ?03/22/2020: TIBIA IM NAIL INSERTION; Right ?    Comment:  Procedure: INTRAMEDULLARY (IM) NAIL TIBIAL;  Surgeon:  ?             KThornton Park MD;  Location: [facility] ORS;  Service:  ?             Orthopedics;  Laterality: Right; ?2001: TUBAL  LIGATION ? ?BMI   ? Body Mass Index: 20.97 kg/m?  ?  ? ? Reproductive/Obstetrics ?negative OB ROS ? ?  ? ? ? ? ? ? ? ? ? ? ? ? ? ?  ?  ? ? ? ? ? ? ? ? ?Anesthesia Physical ?Anesthesia Plan ? ?ASA: 3 ? ?Anesthesia Plan: General  ? ?Post-op Pain Management:   ? ?Induction: Intravenous ? ?PONV Risk Score and Plan: Propofol infusion and TIVA ? ?Airway Management Planned: Natural Airway and Nasal Cannula ? ?Additional Equipment:  ? ?Intra-op Plan:  ? ?Post-operative Plan:  ? ?Informed Consent: I have reviewed the patients History and Physical, chart, labs and discussed the procedure including the risks, benefits and alternatives for the proposed anesthesia with the patient or authorized representative who has indicated his/her understanding and acceptance.  ? ? ? ?Dental Advisory Given ? ?Plan Discussed with: Anesthesiologist, CRNA and Surgeon ? ?Anesthesia Plan Comments: (Patient consented for risks of anesthesia including but not limited to:  ?- adverse reactions to medications ?- risk of airway placement if required ?- damage to eyes, teeth, lips or other oral mucosa ?- nerve damage due to positioning  ?- sore throat or hoarseness ?- Damage to heart, brain, nerves, lungs, other parts of body or loss of life ? ?Patient voiced understanding.)  ? ? ? ? ? ? ?Anesthesia Quick Evaluation ? ?

## 2021-03-26 NOTE — Anesthesia Postprocedure Evaluation (Signed)
Anesthesia Post Note ? ?Patient: Ashley Powers ? ?Procedure(s) Performed: COLONOSCOPY WITH PROPOFOL ? ?Patient location during evaluation: Endoscopy ?Anesthesia Type: General ?Level of consciousness: awake and alert ?Pain management: pain level controlled ?Vital Signs Assessment: post-procedure vital signs reviewed and stable ?Respiratory status: spontaneous breathing, nonlabored ventilation, respiratory function stable and patient connected to nasal cannula oxygen ?Cardiovascular status: blood pressure returned to baseline and stable ?Postop Assessment: no apparent nausea or vomiting ?Anesthetic complications: no ? ? ?No notable events documented. ? ? ?Last Vitals:  ?Vitals:  ? 03/26/21 0945 03/26/21 0955  ?BP: (!) 163/85 (!) 148/100  ?Pulse: 74 66  ?Resp: 13 16  ?Temp:    ?SpO2: 99% 100%  ?  ?Last Pain:  ?Vitals:  ? 03/26/21 0955  ?TempSrc:   ?PainSc: 0-No pain  ? ? ?  ?  ?  ?  ?  ?  ? ?[name] ? ? ? ? ?

## 2021-03-26 NOTE — Op Note (Signed)
[facility] ?Gastroenterology ?Patient Name: Ashley Powers ?Procedure Date: 03/26/2021 9:12 AM ?MRN: [id number] ?Account #: [id number] ?Date of Birth: 1970/09/26 ?Admit Type: Outpatient ?Age: 51 ?Room: [facility] ENDO ROOM 3 ?Gender: Female ?Note Status: Finalized ?Instrument Name: Colonscope [id number] ?Procedure:             Colonoscopy ?Indications:           Screening in patient at increased risk: Family history  ?                       of 1st-degree relative with colorectal cancer before  ?                       age 80 years ?Providers:             Andrey Farmer MD, MD ?Referring MD:          Forest Gleason Md, MD (Referring MD) ?Medicines:             Monitored Anesthesia Care ?Complications:         No immediate complications. Estimated blood loss:  ?                       Minimal. ?Procedure:             Pre-Anesthesia Assessment: ?                       - Prior to the procedure, a History and Physical was  ?                       performed, and patient medications and allergies were  ?                       reviewed. The patient is competent. The risks and  ?                       benefits of the procedure and the sedation options and  ?                       risks were discussed with the patient. All questions  ?                       were answered and informed consent was obtained.  ?                       Patient identification and proposed procedure were  ?                       verified by the physician, the nurse, the  ?                       anesthesiologist, the anesthetist and the technician  ?                       in the endoscopy suite. Mental Status Examination:  ?                       alert and oriented. Airway Examination: normal  ?  oropharyngeal airway and neck mobility. Respiratory  ?                       Examination: clear to auscultation. CV Examination:  ?                       normal. Prophylactic Antibiotics: The patient does not  ?                        require prophylactic antibiotics. Prior  ?                       Anticoagulants: The patient has taken no previous  ?                       anticoagulant or antiplatelet agents. ASA Grade  ?                       Assessment: II - A patient with mild systemic disease.  ?                       After reviewing the risks and benefits, the patient  ?                       was deemed in satisfactory condition to undergo the  ?                       procedure. The anesthesia plan was to use monitored  ?                       anesthesia care (MAC). Immediately prior to  ?                       administration of medications, the patient was  ?                       re-assessed for adequacy to receive sedatives. The  ?                       heart rate, respiratory rate, oxygen saturations,  ?                       blood pressure, adequacy of pulmonary ventilation, and  ?                       response to care were monitored throughout the  ?                       procedure. The physical status of the patient was  ?                       re-assessed after the procedure. ?                       After obtaining informed consent, the colonoscope was  ?                       passed under direct vision. Throughout the procedure,  ?  the patient's blood pressure, pulse, and oxygen  ?                       saturations were monitored continuously. The  ?                       Colonoscope was introduced through the anus and  ?                       advanced to the the terminal ileum. The colonoscopy  ?                       was performed without difficulty. The patient  ?                       tolerated the procedure well. The quality of the bowel  ?                       preparation was good. ?Findings: ?     The perianal and digital rectal examinations were normal. ?     The terminal ileum appeared normal. ?     A 7 mm polyp was found in the descending colon. The polyp was sessile.  ?     Appeared as a sessile  serrated adenoma. The polyp was removed with a  ?     cold snare. Resection and retrieval were complete. Estimated blood loss  ?     was minimal. ?     Internal hemorrhoids were found during retroflexion. The hemorrhoids  ?     were Grade I (internal hemorrhoids that do not prolapse). ?     The exam was otherwise without abnormality on direct and retroflexion  ?     views. ?Impression:            - The examined portion of the ileum was normal. ?                       - One 7 mm polyp in the descending colon, removed with  ?                       a cold snare. Resected and retrieved. ?                       - Internal hemorrhoids. ?                       - The examination was otherwise normal on direct and  ?                       retroflexion views. ?Recommendation:        - Discharge patient to home. ?                       - Resume previous diet. ?                       - Continue present medications. ?                       - Await pathology results. ?                       -  Repeat colonoscopy in 5 years for surveillance. ?                       - Return to referring physician as previously  ?                       scheduled. ?Procedure Code(s):     --- Professional --- ?                       [phone], Colonoscopy, flexible; with removal of  ?                       tumor(s), polyp(s), or other lesion(s) by snare  ?                       technique ?Diagnosis Code(s):     --- Professional --- ?                       Z80.0, Family history of malignant neoplasm of  ?                       digestive organs ?                       K64.0, First degree hemorrhoids ?                       K63.5, Polyp of colon ?CPT copyright 2019 [employer] Association. All rights reserved. ?The codes documented in this report are preliminary and upon coder review may  ?be revised to meet current compliance requirements. ?Andrey Farmer MD, MD ?03/26/2021 9:36:43 AM ?Number of Addenda: 0 ?Note Initiated On: 03/26/2021 9:12 AM ?Scope  Withdrawal Time: 0 hours 11 minutes 3 seconds  ?Total Procedure Duration: 0 hours 14 minutes 21 seconds  ?Estimated Blood Loss:  Estimated blood loss was minimal. ?     [facility] ?

## 2021-03-26 NOTE — Anesthesia Procedure Notes (Signed)
Date/Time: 03/26/2021 9:14 AM ?Performed by: Demetrius Charity, CRNA ?Pre-anesthesia Checklist: Patient identified, Emergency Drugs available, Suction available, Patient being monitored and Timeout performed ?Patient Re-evaluated:Patient Re-evaluated prior to induction ?Oxygen Delivery Method: Nasal cannula ?Induction Type: IV induction ?Placement Confirmation: CO2 detector and positive ETCO2 ? ? ? ? ?

## 2021-03-26 NOTE — Interval H&P Note (Signed)
History and Physical Interval Note: ? ?03/26/2021 ?9:12 AM ? ?Ashley Powers  has presented today for surgery, with the diagnosis of family history colon cancer.  The various methods of treatment have been discussed with the patient and family. After consideration of risks, benefits and other options for treatment, the patient has consented to  Procedure(s): ?COLONOSCOPY WITH PROPOFOL (N/A) as a surgical intervention.  The patient's history has been reviewed, patient examined, no change in status, stable for surgery.  I have reviewed the patient's chart and labs.  Questions were answered to the patient's satisfaction.   ? ? ?Hilton Cork Teran Knittle ? ?Ok to proceed with colonoscopy ?

## 2021-03-27 ENCOUNTER — Encounter: Payer: Self-pay | Admitting: Gastroenterology

## 2021-03-27 LAB — SURGICAL PATHOLOGY

## 2021-12-07 DIAGNOSIS — Z419 Encounter for procedure for purposes other than remedying health state, unspecified: Secondary | ICD-10-CM | POA: Diagnosis not present

## 2022-01-07 DIAGNOSIS — Z419 Encounter for procedure for purposes other than remedying health state, unspecified: Secondary | ICD-10-CM | POA: Diagnosis not present

## 2022-01-24 IMAGING — DX DG TIBIA/FIBULA 2V*R*
4 series · 4 of 4 positions shown · non-contrast
Comparison: 03/21/2020

CLINICAL DATA: Postop, tibial fracture

EXAM:
RIGHT TIBIA AND FIBULA - 2 VIEW

[tibia ap (1 of 2)]
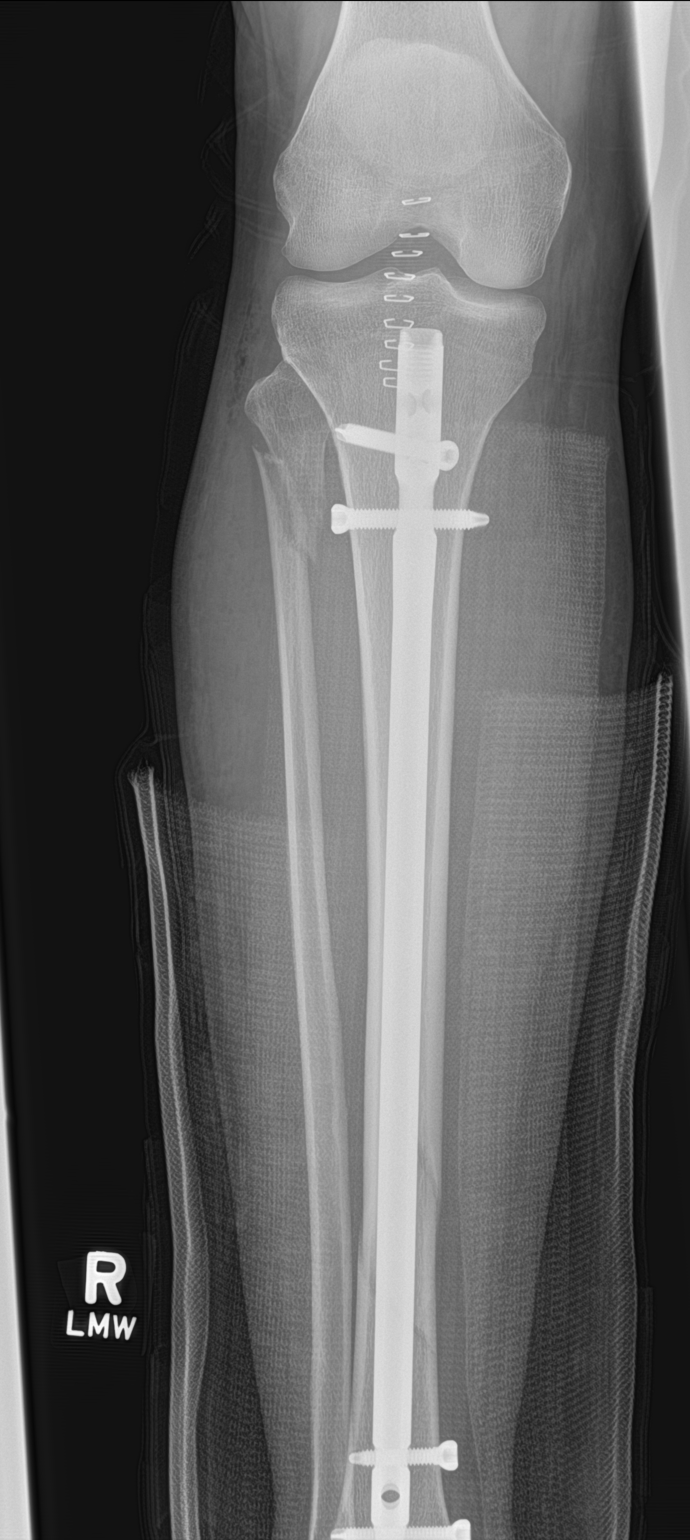

[tibia ap (2 of 2)]
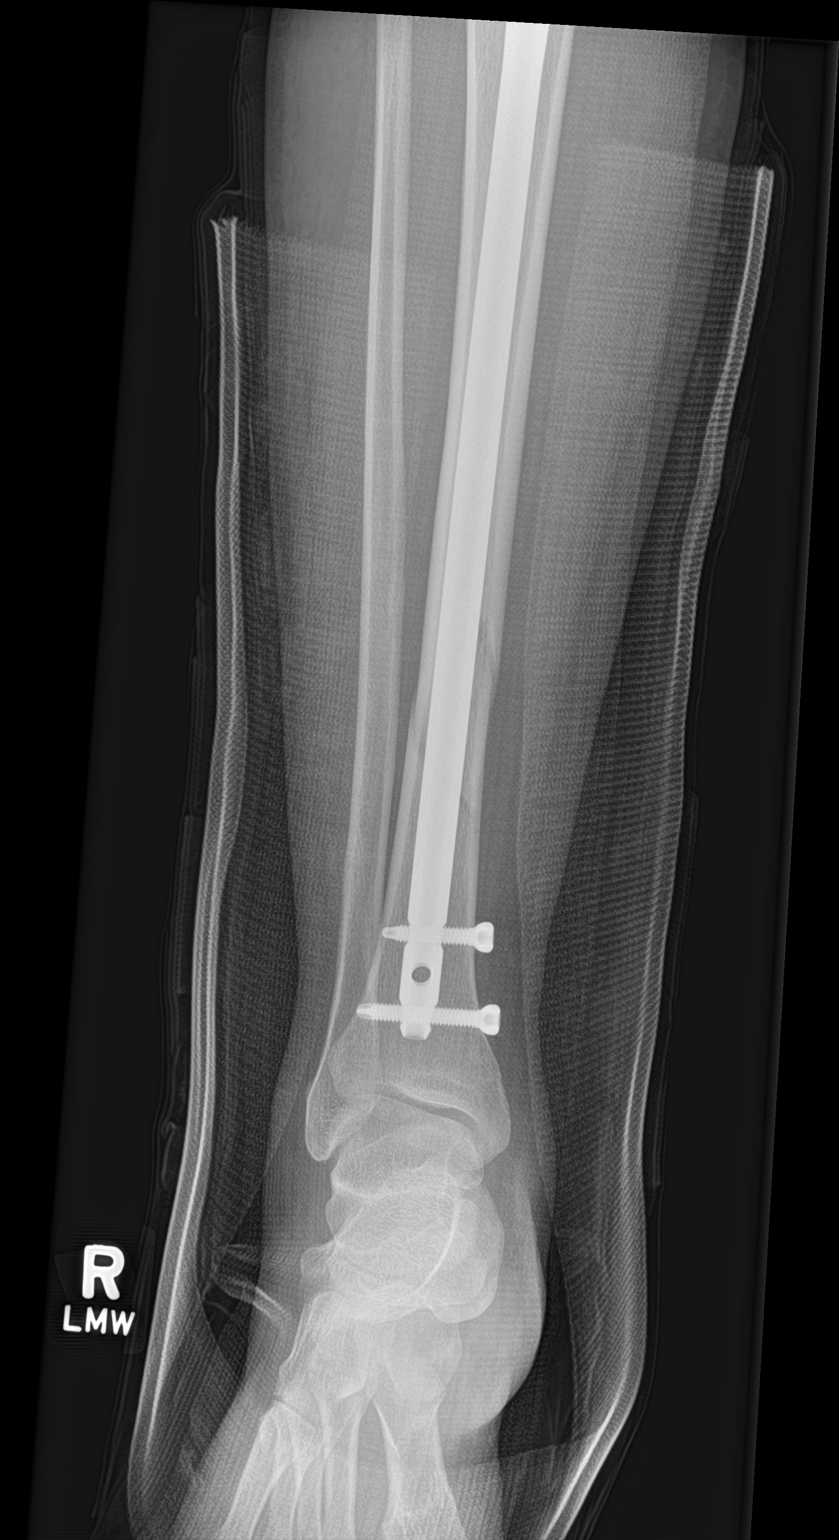

[tibia lat (1 of 2)]
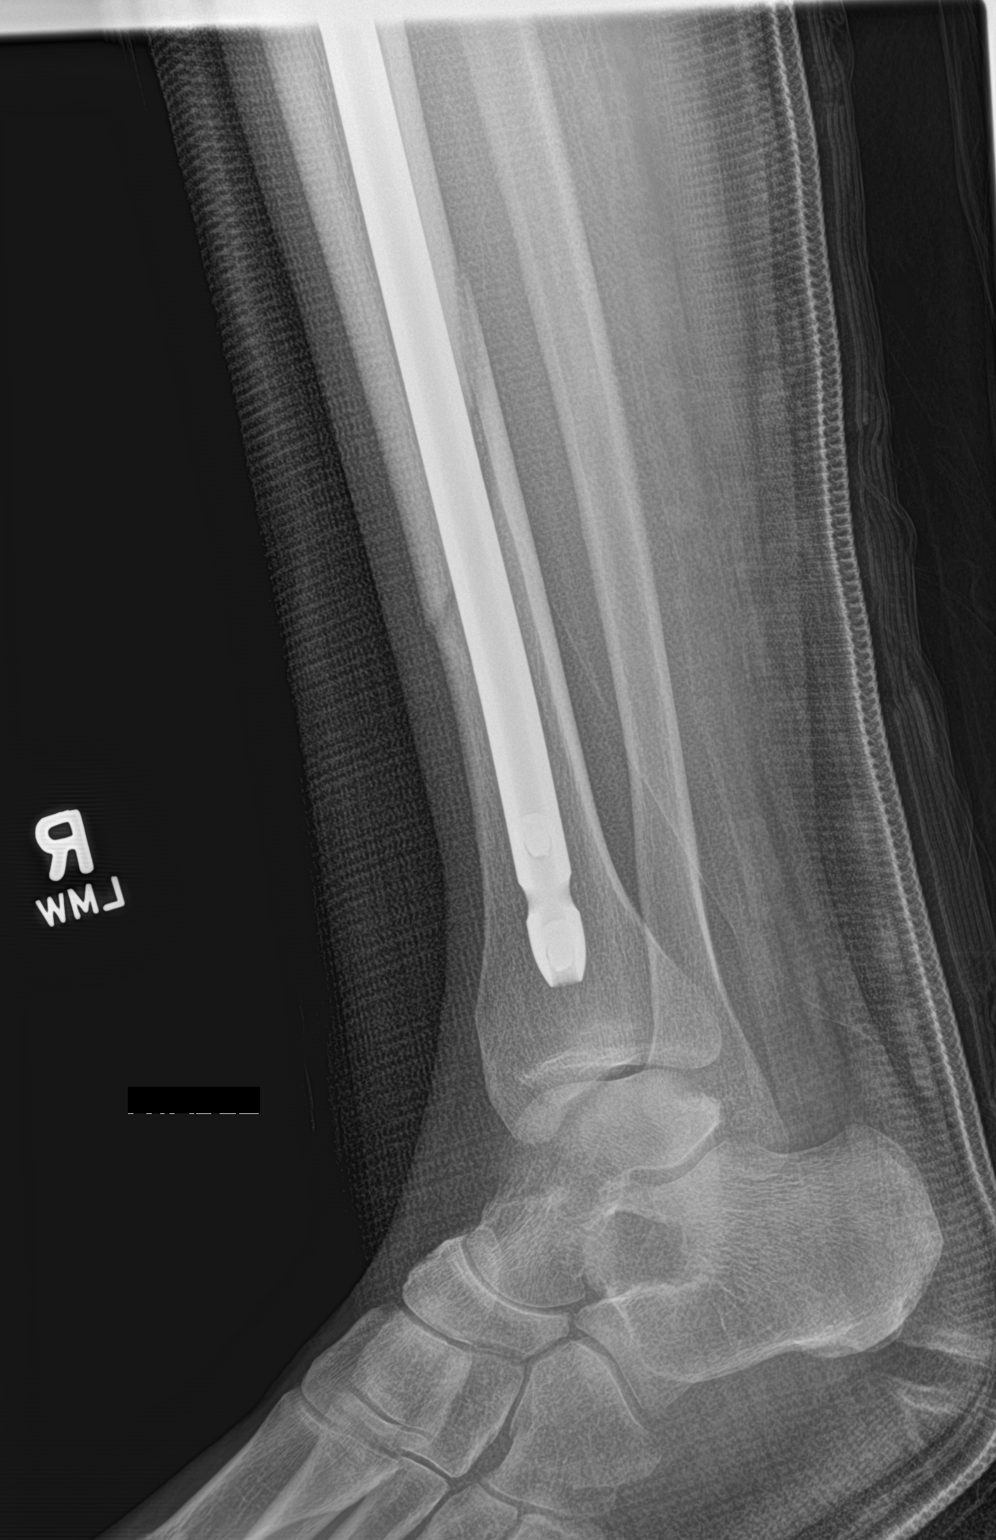

[tibia lat (2 of 2)]
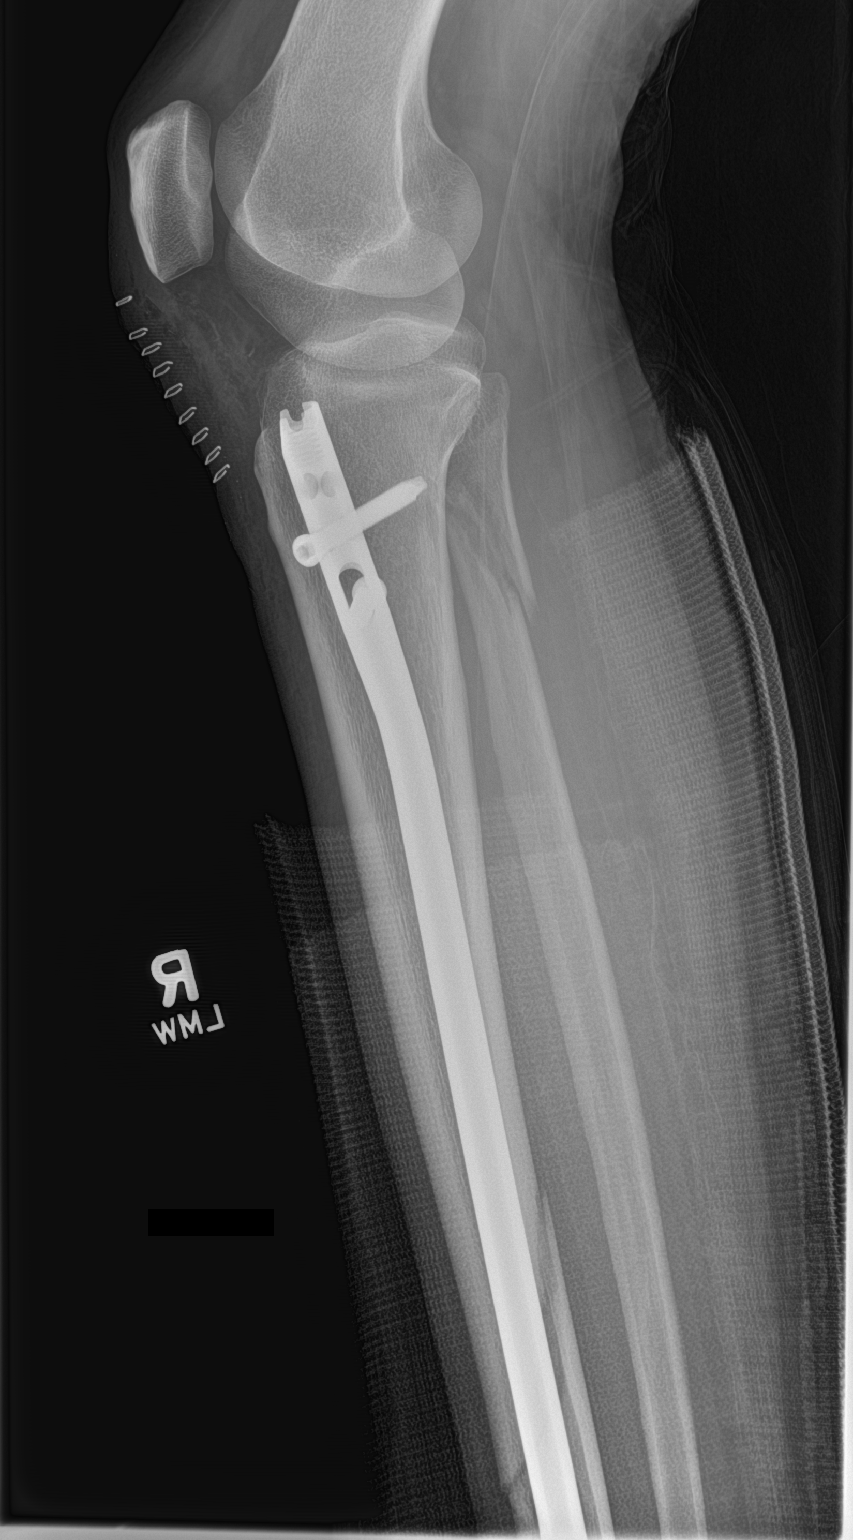

[4 of 4 positions shown; findings below may reference images not displayed]

FINDINGS: Frontal and lateral views of the right tibia and fibula are
obtained. Intramedullary rod with proximal and distal interlocking
screws traverses the oblique distal fibular diaphyseal fracture seen
previously, with anatomic alignment. Oblique proximal fibular
diaphyseal fracture is unchanged. The right knee and ankle are well
aligned. Diffuse soft tissue edema. Overlying casting material.
IMPRESSION: 1. ORIF of a distal tibial fracture with anatomic alignment.
2. Stable proximal fibular head fracture.

## 2022-01-24 IMAGING — XA DG TIBIA/FIBULA 2V*R*
1 series · 4 of 4 positions shown · non-contrast
Comparison: Radiograph yesterday

CLINICAL DATA: ORIF right tibia.

EXAM:
RIGHT TIBIA AND FIBULA - 2 VIEW; DG C-ARM 1-60 MIN

[Series 1: unknown protocol · 0.20mm/px · 4 of 4 slices shown]
[im 1/4]
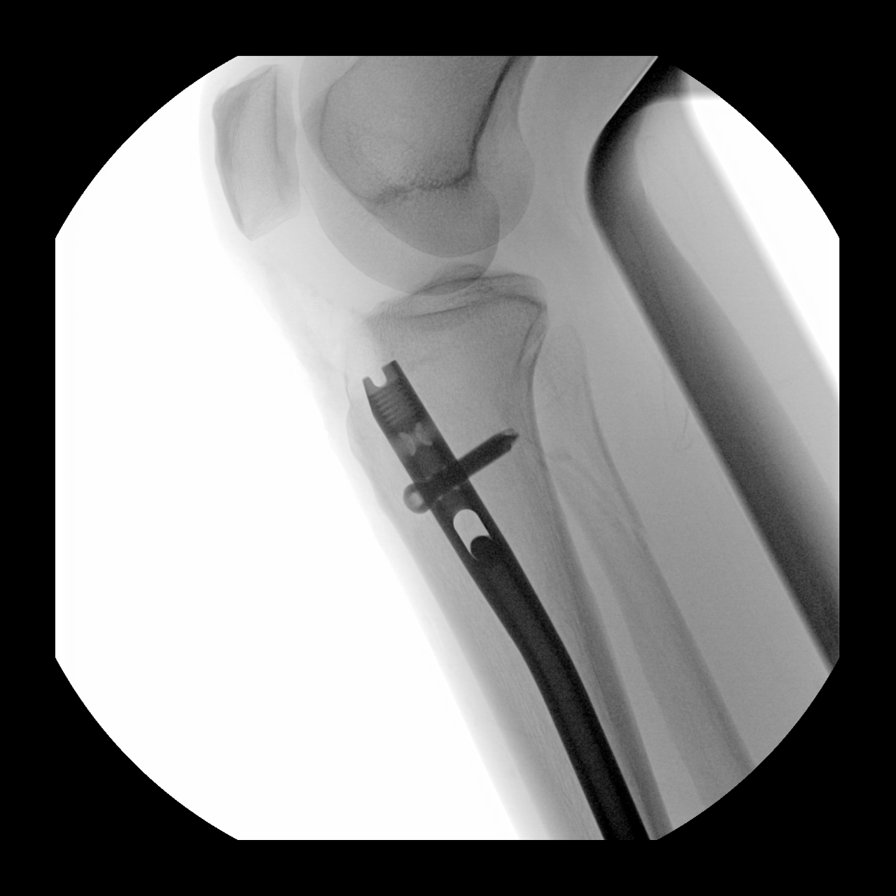
[im 2/4]
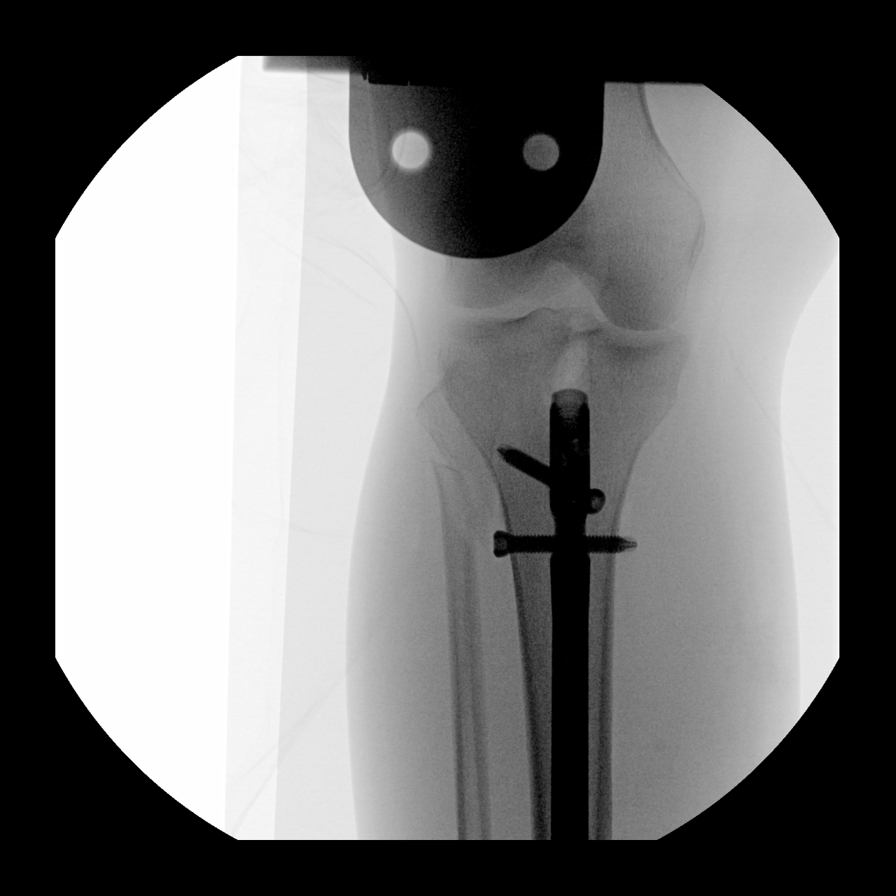
[im 3/4]
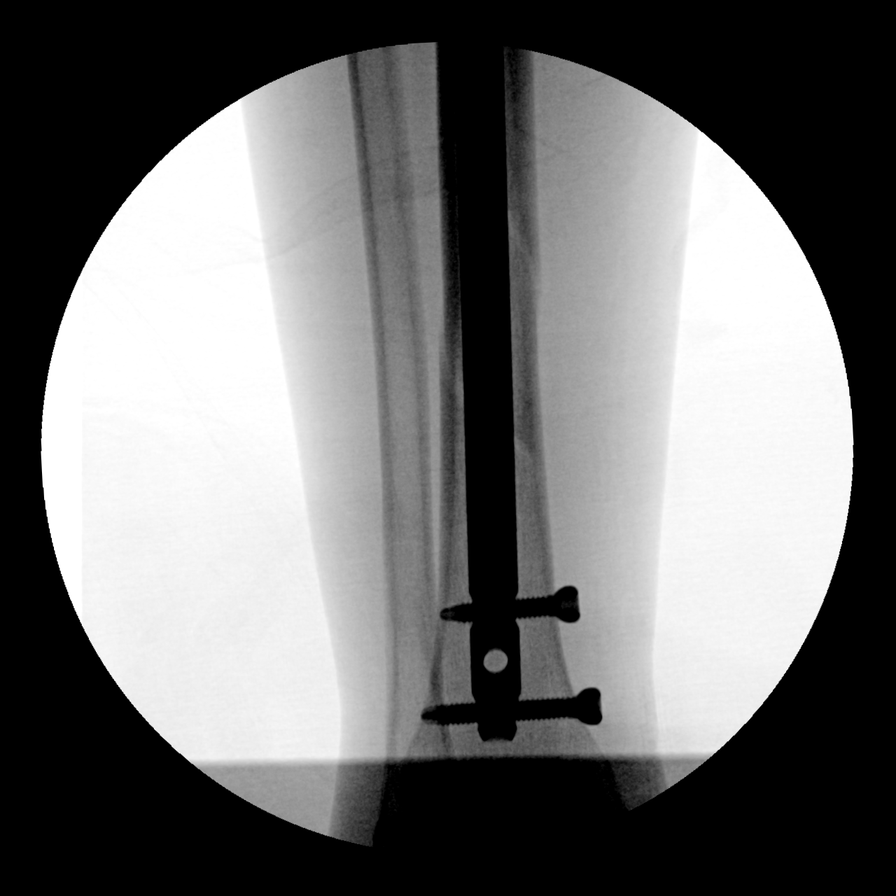
[im 4/4]
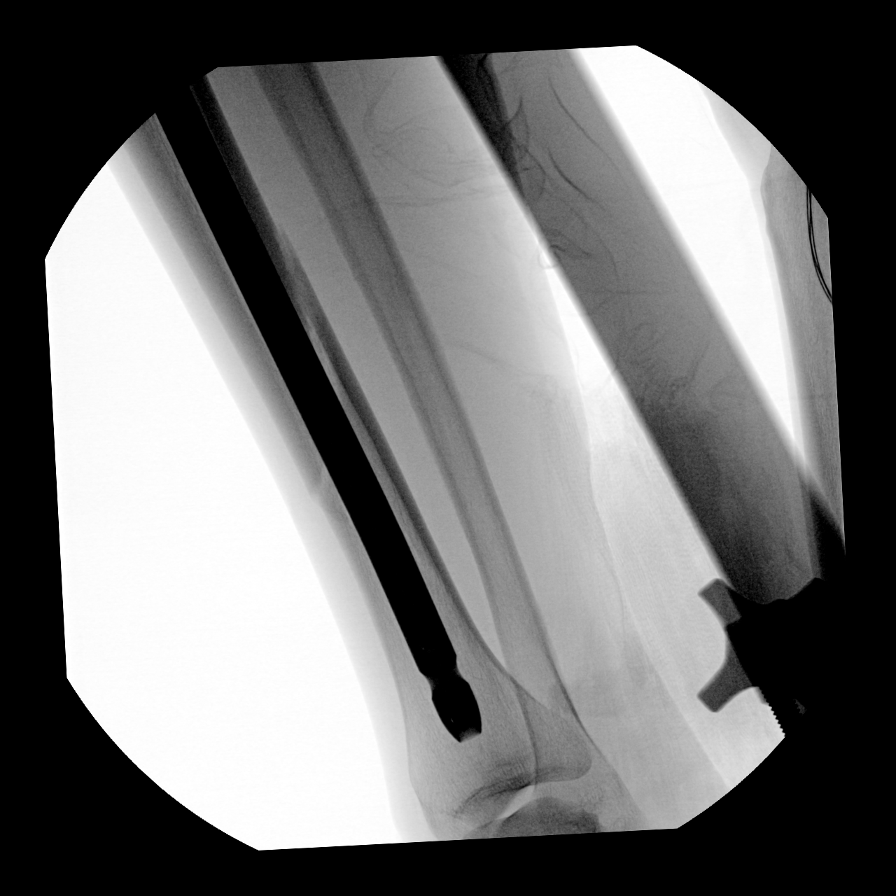

[4 of 4 positions shown; findings below may reference images not displayed]

FINDINGS: Four fluoroscopic spot views of the right lower leg obtained in
frontal and lateral projections. Intramedullary nail with proximal
and distal locking screws traverse tibial shaft fracture. Fracture
is in improved alignment. Proximal fibular fracture is again seen.
Total fluoroscopy time 1 minutes 30 seconds. Dose 3.84 mGy.
IMPRESSION: Intraoperative fluoroscopy during right tibial fracture ORIF.

## 2022-02-07 DIAGNOSIS — Z419 Encounter for procedure for purposes other than remedying health state, unspecified: Secondary | ICD-10-CM | POA: Diagnosis not present

## 2022-05-13 ENCOUNTER — Telehealth: Payer: Self-pay

## 2022-05-13 NOTE — Telephone Encounter (Signed)
Sending mychart msg. AS, CMA
